# Patient Record
Sex: Female | Born: 1970 | Race: White | Hispanic: No | Smoking: Never smoker
Health system: Southern US, Community
[De-identification: ages and names within clinical notes are randomized; demographics above are authoritative.]

## PROBLEM LIST (undated history)

## (undated) DIAGNOSIS — E559 Vitamin D deficiency, unspecified: Secondary | ICD-10-CM

## (undated) DIAGNOSIS — A630 Anogenital (venereal) warts: Secondary | ICD-10-CM

## (undated) DIAGNOSIS — M722 Plantar fascial fibromatosis: Secondary | ICD-10-CM

## (undated) HISTORY — DX: Vitamin D deficiency, unspecified: E55.9

## (undated) HISTORY — DX: Plantar fascial fibromatosis: M72.2

## (undated) HISTORY — PX: EYE SURGERY: SHX253

## (undated) HISTORY — DX: Anogenital (venereal) warts: A63.0

---
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
## 2000-05-08 ENCOUNTER — Encounter: Admission: RE | Admit: 2000-05-08 | Discharge: 2000-05-08 | Payer: Self-pay | Admitting: Internal Medicine

## 2000-05-08 ENCOUNTER — Encounter: Payer: Self-pay | Admitting: Internal Medicine

## 2000-10-11 ENCOUNTER — Other Ambulatory Visit: Admission: RE | Admit: 2000-10-11 | Discharge: 2000-10-11 | Payer: Self-pay | Admitting: Obstetrics and Gynecology

## 2002-07-17 ENCOUNTER — Other Ambulatory Visit: Admission: RE | Admit: 2002-07-17 | Discharge: 2002-07-17 | Payer: Self-pay | Admitting: Obstetrics and Gynecology

## 2003-08-20 ENCOUNTER — Other Ambulatory Visit: Admission: RE | Admit: 2003-08-20 | Discharge: 2003-08-20 | Payer: Self-pay | Admitting: Obstetrics and Gynecology

## 2012-09-29 LAB — HM PAP SMEAR: HM Pap smear: NORMAL

## 2012-11-21 ENCOUNTER — Ambulatory Visit: Payer: Self-pay | Admitting: Internal Medicine

## 2012-12-12 ENCOUNTER — Ambulatory Visit (INDEPENDENT_AMBULATORY_CARE_PROVIDER_SITE_OTHER): Payer: BC Managed Care – PPO | Admitting: Internal Medicine

## 2012-12-12 ENCOUNTER — Telehealth: Payer: Self-pay | Admitting: *Deleted

## 2012-12-12 ENCOUNTER — Encounter: Payer: Self-pay | Admitting: Internal Medicine

## 2012-12-12 VITALS — BP 106/70 | HR 71 | Temp 98.1°F | Resp 14 | Ht 64.0 in | Wt 159.2 lb

## 2012-12-12 DIAGNOSIS — E538 Deficiency of other specified B group vitamins: Secondary | ICD-10-CM

## 2012-12-12 DIAGNOSIS — G8929 Other chronic pain: Secondary | ICD-10-CM

## 2012-12-12 DIAGNOSIS — Z0000 Encounter for general adult medical examination without abnormal findings: Secondary | ICD-10-CM

## 2012-12-12 DIAGNOSIS — M545 Low back pain: Secondary | ICD-10-CM

## 2012-12-12 NOTE — Progress Notes (Signed)
Patient ID: Erin Boone, female   DOB: [birth year], 42 y.o.   MRN: [id number]   Patient Active Problem List   Diagnosis Date Noted  . Chronic low back pain 12/14/2012  . B12 deficiency 12/14/2012  . Routine general medical examination at a health care facility 12/14/2012    Subjective:  CC:   Chief Complaint  Patient presents with  . Establish Care    HPI:   Erin Boone is a 42 y.o. female who presents as a new patient to establish primary care with the chief complaint of Left sided low back pain, chronic,  And constant.  Dull pain most of the time aggravated by standing or stooping or leaning  relieved by lying down.  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  .  Stretches twice weekly with a trainer.  Sharp  Lumbar near her dimple on the left.  Hurst worse after stretching.  Plain films done by   Chiropractor  Years ago.    Past Medical History  Diagnosis Date  . Genital warts     Past Surgical History  Procedure Laterality Date  . Eye surgery    . Tonsillectomy and adenoidectomy Bilateral 1987    Family History  Problem Relation Age of Onset  . Cancer Mother   . Hypertension Mother   . Dementia Mother   . Arthritis Father   . Hypertension Father   . Diabetes Sister   . Diabetes Paternal Aunt   . Diabetes Paternal Uncle   . Heart disease Maternal Grandfather   . Diabetes Paternal Grandmother   . Diabetes Paternal Grandfather     History   Social History  . Marital Status: Married    Spouse Name: N/A    Number of Children: N/A  . Years of Education: N/A   Occupational History  . Not on file.   Social History Main Topics  . Smoking status: Never Smoker   . Smokeless tobacco: Never Used  . Alcohol Use: Yes  . Drug Use: No  . Sexually Active: No   Other Topics Concern  . Not on file   Social History Narrative  . No narrative on file        @ALLHX @    Review of Systems:   The remainder of the review of systems was negative except those addressed in the HPI.       Objective:  BP 106/70  Pulse 71  Temp(Src) 98.1 F (36.7 C) (Oral)  Resp 14  Ht 5\' 4"  (1.626 m)  Wt 159 lb 4 oz (72.235 kg)  BMI 27.32 kg/m2  SpO2 98%  LMP 10/17/2012  General appearance: alert, cooperative and appears stated age Ears: normal TM's and external ear canals both ears Throat: lips, mucosa, and tongue normal; teeth and gums normal Neck: no adenopathy, no carotid bruit, supple, symmetrical, trachea midline and thyroid not enlarged, symmetric, no tenderness/mass/nodules Back: symmetric, no curvature. ROM normal. No CVA tenderness. Lungs: clear to auscultation bilaterally Heart: regular rate and rhythm, S1, S2 normal, no murmur, click, rub or gallop Abdomen: soft, non-tender; bowel sounds normal; no masses,  no organomegaly Pulses: 2+ and symmetric Skin: Skin color, texture, turgor normal. No rashes or lesions Lymph nodes: Cervical, supraclavicular, and axillary nodes normal.  Assessment and Plan:  Chronic low back pain Exam normal.  contineu stretching, core strengthening exercises and prn nsaids,  Weight  loss may help,  Plain films ordered   B12 deficiency History of macroytosis by labs drawn in 2013,  Presume to be b12 deficiency by former PCP.  She has not been taking a b12 supplement.  She will return for fasting labs and b12 level ASAP  Routine general medical examination at a health care facility She is up to date on screening but has not had a baseline mammogram ye.t    Updated Medication List No outpatient encounter prescriptions on file as of 12/12/2012.   No facility-administered encounter medications on file as of 12/12/2012.     Orders Placed This Encounter  Procedures  . DG Lumbar Spine Complete  . HM PAP SMEAR    No Follow-up on file.

## 2012-12-12 NOTE — Telephone Encounter (Signed)
Edison Simon the lab order form from [employer]

## 2012-12-12 NOTE — Telephone Encounter (Signed)
[facility] lab sheet ordered will mail to patient as available . Have Dr. Darrick Huntsman Sign

## 2012-12-12 NOTE — Patient Instructions (Addendum)
You need to have your b12 drawn to determine if you need to resume B12  Supplements (through [facility])   I recommend repeating your spine films at [facility] may use ibuprofen 800 mg ( that's 4 x 200 mg tablets) every 8 hours and  2 tylenol every 8 hours for a few days if your back flares.  maximum tylenol dose is 2000 mg daily if you take it continually

## 2012-12-14 DIAGNOSIS — M545 Other chronic pain: Secondary | ICD-10-CM | POA: Insufficient documentation

## 2012-12-14 DIAGNOSIS — Z0000 Encounter for general adult medical examination without abnormal findings: Secondary | ICD-10-CM | POA: Insufficient documentation

## 2012-12-14 DIAGNOSIS — E538 Deficiency of other specified B group vitamins: Secondary | ICD-10-CM | POA: Insufficient documentation

## 2012-12-14 NOTE — Assessment & Plan Note (Signed)
She is up to date on screening but has not had a baseline mammogram ye.t

## 2012-12-14 NOTE — Assessment & Plan Note (Addendum)
History of macroytosis by labs drawn in 2013,  Presume to be b12 deficiency by former PCP.  She has not been taking a b12 supplement.  She will return for fasting labs and b12 level ASAP

## 2012-12-14 NOTE — Assessment & Plan Note (Signed)
Exam normal.  contineu stretching, core strengthening exercises and prn nsaids,  Weight loss may help,  Plain films ordered

## 2012-12-15 NOTE — Telephone Encounter (Signed)
Placed lab sheet in red folder , will mail to patient when completed.

## 2012-12-22 NOTE — Telephone Encounter (Signed)
Lab sheet mailed to patient.

## 2012-12-23 ENCOUNTER — Encounter: Payer: Self-pay | Admitting: Internal Medicine

## 2012-12-29 ENCOUNTER — Telehealth: Payer: Self-pay | Admitting: *Deleted

## 2012-12-29 DIAGNOSIS — R5381 Other malaise: Secondary | ICD-10-CM

## 2012-12-29 DIAGNOSIS — E538 Deficiency of other specified B group vitamins: Secondary | ICD-10-CM

## 2012-12-29 DIAGNOSIS — E785 Hyperlipidemia, unspecified: Secondary | ICD-10-CM

## 2012-12-29 NOTE — Telephone Encounter (Signed)
Pt is coming in for labs tomorrow 07.15.2014 what labs and dx?

## 2012-12-30 ENCOUNTER — Other Ambulatory Visit (INDEPENDENT_AMBULATORY_CARE_PROVIDER_SITE_OTHER): Payer: BC Managed Care – PPO

## 2012-12-30 DIAGNOSIS — R5381 Other malaise: Secondary | ICD-10-CM

## 2012-12-30 DIAGNOSIS — R5383 Other fatigue: Secondary | ICD-10-CM

## 2012-12-30 DIAGNOSIS — E538 Deficiency of other specified B group vitamins: Secondary | ICD-10-CM

## 2012-12-30 DIAGNOSIS — E785 Hyperlipidemia, unspecified: Secondary | ICD-10-CM

## 2012-12-30 LAB — COMPREHENSIVE METABOLIC PANEL
ALT: 16 U/L (ref 0–35)
AST: 17 U/L (ref 0–37)
Albumin: 4.2 g/dL (ref 3.5–5.2)
Alkaline Phosphatase: 54 U/L (ref 39–117)
BUN: 13 mg/dL (ref 6–23)
CO2: 27 mEq/L (ref 19–32)
Calcium: 9.1 mg/dL (ref 8.4–10.5)
Chloride: 103 mEq/L (ref 96–112)
Creatinine, Ser: 0.7 mg/dL (ref 0.4–1.2)
GFR: 93.1 mL/min (ref >60.00)
Glucose, Bld: 94 mg/dL (ref 70–99)
Potassium: 4.6 mEq/L (ref 3.5–5.1)
Sodium: 135 mEq/L (ref 135–145)
Total Bilirubin: 0.8 mg/dL (ref 0.3–1.2)
Total Protein: 7.2 g/dL (ref 6.0–8.3)

## 2012-12-30 LAB — CBC WITH DIFFERENTIAL/PLATELET
Basophils Absolute: 0 10*3/uL (ref 0.0–0.1)
Basophils Relative: 0.4 % (ref 0.0–3.0)
Eosinophils Absolute: 0.1 10*3/uL (ref 0.0–0.7)
Eosinophils Relative: 1.6 % (ref 0.0–5.0)
HCT: 38.5 % (ref 36.0–46.0)
Hemoglobin: 13.2 g/dL (ref 12.0–15.0)
Lymphocytes Relative: 28.4 % (ref 12.0–46.0)
Lymphs Abs: 1.7 10*3/uL (ref 0.7–4.0)
MCHC: 34.2 g/dL (ref 30.0–36.0)
MCV: 98.9 fl (ref 78.0–100.0)
Monocytes Absolute: 0.5 10*3/uL (ref 0.1–1.0)
Monocytes Relative: 8.7 % (ref 3.0–12.0)
Neutro Abs: 3.6 10*3/uL (ref 1.4–7.7)
Neutrophils Relative %: 60.9 % (ref 43.0–77.0)
Platelets: 189 10*3/uL (ref 150.0–400.0)
RBC: 3.89 Mil/uL (ref 3.87–5.11)
RDW: 12.7 % (ref 11.5–14.6)
WBC: 6 10*3/uL (ref 4.5–10.5)

## 2012-12-30 LAB — LIPID PANEL
HDL: 72.9 mg/dL (ref >39.00)
LDL Cholesterol: 104 mg/dL — ABNORMAL HIGH (ref 0–99)
Total CHOL/HDL Ratio: 3
Triglycerides: 45 mg/dL (ref 0.0–149.0)

## 2012-12-30 LAB — VITAMIN B12: Vitamin B-12: 251 pg/mL (ref 211–911)

## 2012-12-30 LAB — TSH: TSH: 1.28 u[IU]/mL (ref 0.35–5.50)

## 2012-12-31 ENCOUNTER — Encounter: Payer: Self-pay | Admitting: Internal Medicine

## 2013-01-01 ENCOUNTER — Encounter: Payer: Self-pay | Admitting: Internal Medicine

## 2013-02-02 ENCOUNTER — Encounter: Payer: Self-pay | Admitting: Internal Medicine

## 2013-02-02 DIAGNOSIS — A630 Anogenital (venereal) warts: Secondary | ICD-10-CM

## 2013-02-03 DIAGNOSIS — A630 Anogenital (venereal) warts: Secondary | ICD-10-CM | POA: Insufficient documentation

## 2013-02-03 MED ORDER — PODOFILOX 0.5 % EX SOLN: Freq: Two times a day (BID) | CUTANEOUS | Status: DC

## 2013-02-03 NOTE — Telephone Encounter (Signed)
rx sent to pharmacy for podofilox ,m if no resolution after 3 weeks,  Needs appt

## 2013-04-01 ENCOUNTER — Encounter: Payer: Self-pay | Admitting: Internal Medicine

## 2013-04-23 ENCOUNTER — Other Ambulatory Visit: Payer: Self-pay

## 2013-05-22 ENCOUNTER — Telehealth: Payer: Self-pay | Admitting: Internal Medicine

## 2013-05-22 NOTE — Telephone Encounter (Signed)
Pt left vm.  Has questions regarding OTC medication.  Asking for a call so she can explain her symptoms.

## 2013-05-22 NOTE — Telephone Encounter (Signed)
Patient ask what to take OTC for cough advised you normally recommend Delsym right. NO fever, just irritating cough at night advised also if continues she may need an appointment,

## 2014-01-08 ENCOUNTER — Telehealth: Payer: Self-pay | Admitting: Internal Medicine

## 2014-01-08 ENCOUNTER — Ambulatory Visit (INDEPENDENT_AMBULATORY_CARE_PROVIDER_SITE_OTHER)
Admission: RE | Admit: 2014-01-08 | Discharge: 2014-01-08 | Disposition: A | Discharge status: Home / Self Care | Payer: BC Managed Care – PPO | Source: Ambulatory Visit | Attending: Internal Medicine | Admitting: Internal Medicine

## 2014-01-08 DIAGNOSIS — M545 Low back pain, unspecified: Secondary | ICD-10-CM

## 2014-01-08 DIAGNOSIS — M79604 Pain in right leg: Secondary | ICD-10-CM

## 2014-01-08 NOTE — Telephone Encounter (Signed)
Patient called to see if order for Xray for Sciatica nerve pain could still be performed patient is still having trouble with this pain . Order does not expire until 02/11/14 advised patient patient she can still go to [facility] for [employer]. FYI Patient has an appointment with you on 02/12/14

## 2014-01-10 ENCOUNTER — Encounter: Payer: Self-pay | Admitting: Internal Medicine

## 2014-01-10 DIAGNOSIS — M545 Low back pain, unspecified: Secondary | ICD-10-CM

## 2014-01-12 NOTE — Telephone Encounter (Signed)
Please advise patient would like PT in [place] and has to dosage patient of Ibuprofen.

## 2014-01-27 ENCOUNTER — Ambulatory Visit: Payer: BC Managed Care – PPO | Attending: Internal Medicine

## 2014-01-27 DIAGNOSIS — M545 Low back pain, unspecified: Secondary | ICD-10-CM | POA: Insufficient documentation

## 2014-01-27 DIAGNOSIS — IMO0001 Reserved for inherently not codable concepts without codable children: Secondary | ICD-10-CM | POA: Insufficient documentation

## 2014-02-02 ENCOUNTER — Ambulatory Visit: Payer: BC Managed Care – PPO

## 2014-02-02 DIAGNOSIS — IMO0001 Reserved for inherently not codable concepts without codable children: Secondary | ICD-10-CM | POA: Diagnosis not present

## 2014-02-04 ENCOUNTER — Ambulatory Visit: Payer: BC Managed Care – PPO | Admitting: Rehabilitation

## 2014-02-04 DIAGNOSIS — IMO0001 Reserved for inherently not codable concepts without codable children: Secondary | ICD-10-CM | POA: Diagnosis not present

## 2014-02-09 ENCOUNTER — Ambulatory Visit: Payer: BC Managed Care – PPO

## 2014-02-09 DIAGNOSIS — IMO0001 Reserved for inherently not codable concepts without codable children: Secondary | ICD-10-CM | POA: Diagnosis not present

## 2014-02-11 ENCOUNTER — Ambulatory Visit: Payer: BC Managed Care – PPO | Admitting: Physical Therapy

## 2014-02-12 ENCOUNTER — Ambulatory Visit (INDEPENDENT_AMBULATORY_CARE_PROVIDER_SITE_OTHER): Payer: BC Managed Care – PPO | Admitting: Internal Medicine

## 2014-02-12 ENCOUNTER — Encounter: Payer: Self-pay | Admitting: Internal Medicine

## 2014-02-12 VITALS — BP 104/68 | HR 75 | Temp 97.9°F | Resp 16 | Ht 64.25 in | Wt 165.2 lb

## 2014-02-12 DIAGNOSIS — M545 Low back pain, unspecified: Secondary | ICD-10-CM

## 2014-02-12 DIAGNOSIS — G8929 Other chronic pain: Secondary | ICD-10-CM

## 2014-02-12 DIAGNOSIS — E663 Overweight: Secondary | ICD-10-CM

## 2014-02-12 DIAGNOSIS — M79609 Pain in unspecified limb: Secondary | ICD-10-CM

## 2014-02-12 DIAGNOSIS — Z1239 Encounter for other screening for malignant neoplasm of breast: Secondary | ICD-10-CM

## 2014-02-12 DIAGNOSIS — R5383 Other fatigue: Secondary | ICD-10-CM

## 2014-02-12 DIAGNOSIS — R635 Abnormal weight gain: Secondary | ICD-10-CM

## 2014-02-12 DIAGNOSIS — M722 Plantar fascial fibromatosis: Secondary | ICD-10-CM

## 2014-02-12 DIAGNOSIS — M79661 Pain in right lower leg: Secondary | ICD-10-CM

## 2014-02-12 DIAGNOSIS — R5381 Other malaise: Secondary | ICD-10-CM

## 2014-02-12 MED ORDER — MELOXICAM 15 MG PO TABS: 15.0000 mg | ORAL_TABLET | Freq: Every day | ORAL | Status: DC

## 2014-02-12 MED ORDER — TRAMADOL HCL 50 MG PO TABS: 50.0000 mg | ORAL_TABLET | Freq: Three times a day (TID) | ORAL | Status: DC | PRN

## 2014-02-12 NOTE — Progress Notes (Signed)
Pre-visit discussion using our clinic review tool. No additional management support is needed unless otherwise documented below in the visit note.  

## 2014-02-12 NOTE — Patient Instructions (Addendum)
For your back pain  Once daily meloxicam and 1000 mg tylenol  in the am. No more ibuprofen  Evening:  Tramadol  In the evening   Referral to Erin Boone for therapy for the back   For the weight loss:  Low carb diet 30 minutes of low impact aerobics 5 days per week Phentermine appetite suppressant if no results in one month       This is  my version of a  "Low GI"  Diet:  It will  allow you to lose 4 to 8  lbs  per month if you follow it carefully.  Your goal with exercise is a minimum of 30 minutes of aerobic exercise 5 days per week (Walking does not count once it becomes easy!)     All of the foods can be found at grocery stores and in bulk at [employer].  The Atkins protein bars and shakes are available in more varieties at Target, [employer] and [employer].     7 AM Breakfast:  Choose from the following:  Low carbohydrate Protein  Shakes (I recommend the EAS AdvantEdge "Carb Control" shakes  Or the low carb shakes by Atkins.    2.5 carbs   Arnold's "Sandwhich Thin"toasted  w/ peanut butter (no jelly: about 20 net carbs  "Bagel Thin" with cream cheese and salmon: about 20 carbs   a scrambled egg/bacon/cheese burrito made with Mission's "carb balance" whole wheat tortilla  (about 10 net carbs )  A slice of home made fritatta (egg based dish without a crust:  google it)    Avoid cereal and bananas, oatmeal and cream of wheat and grits. They are loaded with carbohydrates!   10 AM: high protein snack  Protein bar by Atkins (the snack size, under 200 cal, usually < 6 net carbs).    A stick of cheese:  Around 1 carb,  100 cal     Dannon Light n Fit [place] Yogurt  (80 cal, 8 carbs)  Other so called "protein bars" and Greek yogurts tend to be loaded with carbohydrates.  Remember, in food advertising, the word "energy" is synonymous for " carbohydrate."  Lunch:   A Sandwich using the bread choices listed, Can use any  Eggs,  lunchmeat, grilled meat or canned tuna), avocado, regular  mayo/mustard  and cheese.  A Salad using blue cheese, ranch,  Goddess or vinagrette,  No croutons or "confetti" and no "candied nuts" but regular nuts OK.   No pretzels or chips.  Pickles and miniature sweet peppers are a good low carb alternative that provide a "crunch"  The bread is the only source of carbohydrate in a sandwich and  can be decreased by trying some of these alternatives to traditional loaf bread  Erin Boone makes a pita bread and a flat bread that are 50 cal and 4 net carbs available at [place] [employer] [place].  This can be toasted to use with hummous as well  Toufayan makes a low carb flatbread that's 100 cal and 9 net carbs available at [employer] and [employer]J's makes 2 sizes of  Low carb whole wheat tortilla  (The large one is 210 cal and 6 net carbs) Avoid "Low fat dressings, as well as Barry Brunner and [place] dressings They are loaded with sugar!   3 PM/ Mid day  Snack:  Consider  1 ounce of  almonds, walnuts, pistachios, pecans, peanuts,  Macadamia nuts or a nut medley.  Avoid "granola"; the dried  cranberries and raisins are loaded with carbohydrates. Mixed nuts as long as there are no raisins,  cranberries or dried fruit.    Try the prosciutto/mozzarella cheese sticks by Fiorruci  In deli /backery section   High protein   To avoid overindulging in snacks: Try drinking a glass of unsweeted almond/coconut milk  Or a cup of coffee with your Atkins chocolate bar t o keep you from having 3!!!        6 PM  Dinner:     Meat/fowl/fish with a green salad, and either broccoli, cauliflower, green beans, spinach, brussel sprouts or  Lima beans. DO NOT BREAD THE PROTEIN!!      There is a low carb pasta by Dreamfield's that is acceptable and tastes great: only 5 digestible carbs/serving.( All grocery stores but BJs carry it )  Try Hurley Cisco Angelo's chicken piccata or chicken or eggplant parm over low carb pasta.(Lowes and BJs)   Marjory Lies Sanchez's "Carnitas" (pulled pork, no sauce,  0  carbs) or his beef pot roast to make a dinner burrito (at BJ's)  Pesto over low carb pasta (bj's sells a good quality pesto in the center refrigerated section of the deli   Try satueeing  Cheral Marker with mushroooms  Whole wheat pasta is still full of digestible carbs and  Not as low in glycemic index as Dreamfield's.   Brown rice is still rice,  So skip the rice and noodles if you eat [place] or [place] (or at least limit to 1/2 cup)  9 PM snack :   Breyer's "low carb" fudgsicle or  ice cream bar (Carb Smart line), or  Weight Watcher's ice cream bar , or another "no sugar added" ice cream;  a serving of fresh berries/cherries with whipped cream   Cheese or DANNON'S LlGHT N FIT GREEK YOGURT or the Oikos greek yogurt   8 ounces of [place] unsweetened almond/cococunut milk    Avoid bananas, pineapple, grapes  and watermelon on a regular basis because they are high in sugar.  THINK OF THEM AS DESSERT  Remember that snack Substitutions should be less than 10 NET carbs per serving and meals < 20 carbs. Remember to subtract fiber grams to get the "net carbs."

## 2014-02-12 NOTE — Progress Notes (Signed)
Patient ID: Erin Boone, female   DOB: October 03, 1970, 43 y.o.   MRN: [id number]  Patient Active Problem List   Diagnosis Date Noted  . Right calf pain 02/14/2014  . Plantar fasciitis of left foot 02/14/2014  . Overweight (BMI 25.0-29.9) 02/14/2014  . Vulvovaginal warts 02/03/2013  . Chronic low back pain 12/14/2012  . B12 deficiency 12/14/2012  . Routine general medical examination at a health care facility 12/14/2012    Subjective:  CC:   Chief Complaint  Patient presents with  . Annual Exam    sees  Gyn for PAP and breast exam.  . Back Pain    Physical therapy last 2 weeks. Has not seen a cahnge  . Leg Pain    right calf pain feels like an electrical jolt,    HPI:   Erin Boone is a 43 y.o. female who presents for her annual exam with Multiple pain  complaints.. She was last seen a year ago as a new patient.  Due to the time needed to evaluate her complaints. No annual exam was done today.   Left sided sacroiliac pain 24/.7 aggravated by arching back and forward flexion .  She wakes up with exruciating pain in the lower back .  Has been receiving PT for the  past 2 weeks, and despite use of an applied steroid patch and stretching , her pain has not improved.   Right calf has developed a pain in a spcific area that is severe when touched but not otherwise.  It was found by her massage therapist.  Has not been present for a few days.  He suggested it may be referred from her back .  Pain on the plantar surface of her Left foot on in the mid foot region feels like a bone bruise .   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  Sharp  Lumbar near her dimple on the left.  Hurst worse after stretching.  Plain films done by   Chiropractor     Past Medical History  Diagnosis Date  . Genital warts     Past Surgical History  Procedure Laterality Date  . Eye surgery    . Tonsillectomy and  adenoidectomy Bilateral 1987       The following portions of the patient's history were reviewed and updated as appropriate: Allergies, current medications, and problem list.    Review of Systems:   Patient denies headache, fevers, malaise, unintentional weight loss, skin rash, eye pain, sinus congestion and sinus pain, sore throat, dysphagia,  hemoptysis , cough, dyspnea, wheezing, chest pain, palpitations, orthopnea, edema, abdominal pain, nausea, melena, diarrhea, constipation, flank pain, dysuria, hematuria, urinary  Frequency, nocturia, numbness, tingling, seizures,  Focal weakness, Loss of consciousness,  Tremor, insomnia, depression, anxiety, and suicidal ideation.     History   Social History  . Marital Status: Married    Spouse Name: N/A    Number of Children: N/A  . Years of Education: N/A   Occupational History  . Not on file.   Social History Main Topics  . Smoking status: Never Smoker   . Smokeless tobacco: Never Used  . Alcohol Use: Yes  . Drug Use: No  . Sexual Activity: No   Other Topics Concern  . Not on file   Social History Narrative  . No narrative on file  Objective:  Filed Vitals:   02/12/14 1536  BP: 104/68  Pulse: 75  Temp: 97.9 F (36.6 C)  Resp: 16     General appearance: alert, cooperative and appears stated age Ears: normal TM's and external ear canals both ears Throat: lips, mucosa, and tongue normal; teeth and gums normal Neck: no adenopathy, no carotid bruit, supple, symmetrical, trachea midline and thyroid not enlarged, symmetric, no tenderness/mass/nodules Back: symmetric, no curvature. ROM normal. No CVA tenderness. Lungs: clear to auscultation bilaterally Heart: regular rate and rhythm, S1, S2 normal, no murmur, click, rub or gallop Abdomen: soft, non-tender; bowel sounds normal; no masses,  no organomegaly Pulses: 2+ and symmetric Skin: Skin color, texture, turgor normal. No rashes or lesions Lymph nodes: Cervical,  supraclavicular, and axillary nodes normal. MSK: lumbosacral tenderness over sacroilaic joints,  Negative straight leg lift. Patellar DTRs normal Right Calf nontender,  No swelling or erythema.   Assessment and Plan:  Chronic low back pain With prior plain films done by her chiropractor a year ago.  No recent injury to aggravate pain.  No improvement with PT. Need to rule out spinal stenosis.  Plain films ordere d  Right calf pain She has no risk factors fo DVT and has a negative Homan's sign. She is currently pain free. Agree with massage therapist that her pain is MSK but not liley referred from back given focal area of concern previously noted.   Plantar fasciitis of left foot Suggested by history and exam.  Relieving measures suggested,  Podiatry referral if no improvement.   Overweight (BMI 25.0-29.9) I have addressed  BMI and recommended wt loss of 10% of body weight over the next 6 months using a low glycemic index diet and regular exercise a minimum of 5 days per week.   She will return for fasting lipids,  Diabetes and thyroid screen.    A total of 40 minutes was spent with patient more than half of which was spent in counseling patient on the above mentioned issues ,  and coordination of care.  Updated Medication List Outpatient Encounter Prescriptions as of 02/12/2014  Medication Sig  . cyanocobalamin 1000 MCG tablet Take 100 mcg by mouth daily.  Marland Kitchen ibuprofen (ADVIL,MOTRIN) 200 MG tablet Take 600 mg by mouth every 8 (eight) hours as needed.  . meloxicam (MOBIC) 15 MG tablet Take 1 tablet (15 mg total) by mouth daily.  . podofilox (CONDYLOX) 0.5 % external solution Apply topically 2 (two) times daily. For three days,  Then stop.,  May repeat weekly x 4  . traMADol (ULTRAM) 50 MG tablet Take 1 tablet (50 mg total) by mouth every 8 (eight) hours as needed.     Orders Placed This Encounter  Procedures  . MM DIGITAL SCREENING BILATERAL  . CBC with Differential  .  Comprehensive metabolic panel  . TSH  . Lipid panel    No Follow-up on file.

## 2014-02-14 ENCOUNTER — Encounter: Payer: Self-pay | Admitting: Internal Medicine

## 2014-02-14 DIAGNOSIS — E66811 Obesity, class 1: Secondary | ICD-10-CM | POA: Insufficient documentation

## 2014-02-14 DIAGNOSIS — M79661 Pain in right lower leg: Secondary | ICD-10-CM | POA: Insufficient documentation

## 2014-02-14 DIAGNOSIS — E663 Overweight: Secondary | ICD-10-CM | POA: Insufficient documentation

## 2014-02-14 DIAGNOSIS — E669 Obesity, unspecified: Secondary | ICD-10-CM | POA: Insufficient documentation

## 2014-02-14 DIAGNOSIS — M722 Plantar fascial fibromatosis: Secondary | ICD-10-CM | POA: Insufficient documentation

## 2014-02-14 HISTORY — DX: Obesity, class 1: E66.811

## 2014-02-14 NOTE — Assessment & Plan Note (Signed)
Suggested by history and exam.  Relieving measures suggested,  Podiatry referral if no improvement.

## 2014-02-14 NOTE — Assessment & Plan Note (Signed)
I have addressed  BMI and recommended wt loss of 10% of body weight over the next 6 months using a low glycemic index diet and regular exercise a minimum of 5 days per week.   She will return for fasting lipids,  Diabetes and thyroid screen.

## 2014-02-14 NOTE — Assessment & Plan Note (Signed)
With prior plain films done by her chiropractor a year ago.  No recent injury to aggravate pain.  No improvement with PT. Need to rule out spinal stenosis.  Plain films ordere d

## 2014-02-14 NOTE — Assessment & Plan Note (Signed)
She has no risk factors fo DVT and has a negative Homan's sign. She is currently pain free. Agree with massage therapist that her pain is MSK but not liley referred from back given focal area of concern previously noted.

## 2014-02-15 ENCOUNTER — Encounter: Payer: Self-pay | Admitting: Internal Medicine

## 2014-02-15 ENCOUNTER — Ambulatory Visit (INDEPENDENT_AMBULATORY_CARE_PROVIDER_SITE_OTHER): Payer: BC Managed Care – PPO | Admitting: Internal Medicine

## 2014-02-15 VITALS — BP 108/66 | HR 87 | Temp 97.9°F | Wt 162.0 lb

## 2014-02-15 DIAGNOSIS — R52 Pain, unspecified: Secondary | ICD-10-CM

## 2014-02-15 DIAGNOSIS — K2970 Gastritis, unspecified, without bleeding: Secondary | ICD-10-CM

## 2014-02-15 DIAGNOSIS — K2990 Gastroduodenitis, unspecified, without bleeding: Secondary | ICD-10-CM

## 2014-02-15 DIAGNOSIS — R109 Unspecified abdominal pain: Secondary | ICD-10-CM

## 2014-02-15 DIAGNOSIS — R1110 Vomiting, unspecified: Secondary | ICD-10-CM

## 2014-02-15 DIAGNOSIS — R1115 Cyclical vomiting syndrome unrelated to migraine: Secondary | ICD-10-CM

## 2014-02-15 LAB — POCT URINALYSIS DIPSTICK
Bilirubin, UA: NEGATIVE
Glucose, UA: NEGATIVE
Ketones, UA: NEGATIVE
Leukocytes, UA: NEGATIVE
NITRITE UA: NEGATIVE
Protein, UA: NEGATIVE
Spec Grav, UA: <=1.005
UROBILINOGEN UA: NEGATIVE
pH, UA: 6

## 2014-02-15 MED ORDER — PANTOPRAZOLE SODIUM 40 MG PO TBEC: DELAYED_RELEASE_TABLET | ORAL | Status: DC

## 2014-02-15 MED ORDER — ONDANSETRON HCL 4 MG PO TABS: 4.0000 mg | ORAL_TABLET | Freq: Three times a day (TID) | ORAL | Status: DC | PRN

## 2014-02-15 NOTE — Patient Instructions (Addendum)

## 2014-02-15 NOTE — Addendum Note (Signed)
Addended by: Lurlean Nanny on: 02/15/2014 11:50 AM   Modules accepted: Orders

## 2014-02-15 NOTE — Progress Notes (Signed)
Subjective:    Patient ID: Erin Boone, female    DOB: [birth year], 43 y.o.   MRN: [id number]  HPI  Pt presents to the clinic today with c/o abdominal pain, nausea and diarrhea. She reports this started 2 days ago. She has had some associated body aches and chills. She is unsure if she has had a fever. The abdominal pain is intermittent. She describes it as sharp and crampy. It seems to be worse if she lays on her left side. Her stools are loose, but normal in color. She denies blood in her stool. She has tried pepto bismol and tramadol with minimal relief. She did start meloxicam and tramadol just prior to the abdominal pain occurring. She has not taken either of these medications before. She has not had sick contacts that she is aware of.  Review of Systems      Past Medical History  Diagnosis Date  . Genital warts     Current Outpatient Prescriptions  Medication Sig Dispense Refill  . cyanocobalamin 1000 MCG tablet Take 100 mcg by mouth daily.      Marland Kitchen ibuprofen (ADVIL,MOTRIN) 200 MG tablet Take 600 mg by mouth every 8 (eight) hours as needed.      . meloxicam (MOBIC) 15 MG tablet Take 1 tablet (15 mg total) by mouth daily.  30 tablet  3  . podofilox (CONDYLOX) 0.5 % external solution Apply topically 2 (two) times daily. For three days,  Then stop.,  May repeat weekly x 4  3.5 mL  0  . traMADol (ULTRAM) 50 MG tablet Take 1 tablet (50 mg total) by mouth every 8 (eight) hours as needed.  90 tablet  2   No current facility-administered medications for this visit.    No Known Allergies  Family History  Problem Relation Age of Onset  . Cancer Mother   . Hypertension Mother   . Dementia Mother   . Arthritis Father   . Hypertension Father   . Diabetes Sister   . Diabetes Paternal Aunt   . Diabetes Paternal Uncle   . Heart disease Maternal Grandfather   . Diabetes Paternal Grandmother   . Diabetes Paternal Grandfather     History   Social History  . Marital Status: Married      Spouse Name: N/A    Number of Children: N/A  . Years of Education: N/A   Occupational History  . Not on file.   Social History Main Topics  . Smoking status: Never Smoker   . Smokeless tobacco: Never Used  . Alcohol Use: Yes  . Drug Use: No  . Sexual Activity: No   Other Topics Concern  . Not on file   Social History Narrative  . No narrative on file     Constitutional: Denies fever, malaise, fatigue, headache or abrupt weight changes.  Respiratory: Denies difficulty breathing, shortness of breath, cough or sputum production.   Cardiovascular: Denies chest pain, chest tightness, palpitations or swelling in the hands or feet.  Gastrointestinal: Pt reports abdominal pain, nausea, vomiting and diarrhea. . Denies bloating, constipation, or blood in the stool.    No other specific complaints in a complete review of systems (except as listed in HPI above).  Objective:   Physical Exam  BP 108/66  Pulse 87  Temp(Src) 97.9 F (36.6 C) (Oral)  Wt 162 lb (73.483 kg)  SpO2 98%  LMP 01/18/2014 Wt Readings from Last 3 Encounters:  02/15/14 162 lb (73.483 kg)  02/12/14 165  lb 4 oz (74.957 kg)  12/12/12 159 lb 4 oz (72.235 kg)    General: Appears her stated age, well developed, well nourished in NAD. Cardiovascular: Normal rate and rhythm. S1,S2 noted.  No murmur, rubs or gallops noted. Pulmonary/Chest: Normal effort and positive vesicular breath sounds. No respiratory distress. No wheezes, rales or ronchi noted.  Abdomen: Soft and generally tender. Hypoactive bowel sounds, no bruits noted. No distention or masses noted. Liver, spleen and kidneys non palpable.   BMET    Component Value Date/Time   NA 135 12/30/2012 1009   K 4.6 12/30/2012 1009   CL 103 12/30/2012 1009   CO2 27 12/30/2012 1009   GLUCOSE 94 12/30/2012 1009   BUN 13 12/30/2012 1009   CREATININE 0.7 12/30/2012 1009   CALCIUM 9.1 12/30/2012 1009    Lipid Panel     Component Value Date/Time   CHOL 186  12/30/2012 1009   TRIG 45.0 12/30/2012 1009   HDL 72.90 12/30/2012 1009   CHOLHDL 3 12/30/2012 1009   VLDL 9.0 12/30/2012 1009   LDLCALC 104* 12/30/2012 1009    CBC    Component Value Date/Time   WBC 6.0 12/30/2012 1009   RBC 3.89 12/30/2012 1009   HGB 13.2 12/30/2012 1009   HCT 38.5 12/30/2012 1009   PLT 189.0 12/30/2012 1009   MCV 98.9 12/30/2012 1009   MCHC 34.2 12/30/2012 1009   RDW 12.7 12/30/2012 1009   LYMPHSABS 1.7 12/30/2012 1009   MONOABS 0.5 12/30/2012 1009   EOSABS 0.1 12/30/2012 1009   BASOSABS 0.0 12/30/2012 1009    Hgb A1C No results found for this basename: HGBA1C         Assessment & Plan:   Gastritis:  Likely d/t to taking Meloxicam and Tramadol too close together Will start protonix to help coat the stomach short term eRx for zofran for nausea Try imodium instead of pepto bismol- no more than 2 in 24 hours period as this may constipate you Avoid taking the meloxicam and tramadol over the next week Try 1000 mg Tylenol every 8 hours as needed for pain  RTC as needed or if symptoms persist or worsen

## 2014-02-15 NOTE — Progress Notes (Signed)
Pre visit review using our clinic review tool, if applicable. No additional management support is needed unless otherwise documented below in the visit note. 

## 2014-02-26 ENCOUNTER — Other Ambulatory Visit: Payer: BC Managed Care – PPO

## 2014-07-15 ENCOUNTER — Other Ambulatory Visit: Payer: Self-pay | Admitting: Internal Medicine

## 2014-07-15 ENCOUNTER — Ambulatory Visit
Admission: RE | Admit: 2014-07-15 | Discharge: 2014-07-15 | Disposition: A | Discharge status: Home / Self Care | Payer: BLUE CROSS/BLUE SHIELD | Source: Ambulatory Visit | Attending: Internal Medicine | Admitting: Internal Medicine

## 2014-07-15 ENCOUNTER — Ambulatory Visit: Payer: Self-pay

## 2014-07-15 DIAGNOSIS — Z1239 Encounter for other screening for malignant neoplasm of breast: Secondary | ICD-10-CM

## 2014-07-15 DIAGNOSIS — Z1231 Encounter for screening mammogram for malignant neoplasm of breast: Secondary | ICD-10-CM

## 2014-08-20 ENCOUNTER — Other Ambulatory Visit (INDEPENDENT_AMBULATORY_CARE_PROVIDER_SITE_OTHER): Payer: BLUE CROSS/BLUE SHIELD

## 2014-08-20 ENCOUNTER — Ambulatory Visit
Admission: RE | Admit: 2014-08-20 | Discharge: 2014-08-20 | Disposition: A | Discharge status: Home / Self Care | Payer: BLUE CROSS/BLUE SHIELD | Source: Ambulatory Visit | Attending: Internal Medicine | Admitting: Internal Medicine

## 2014-08-20 ENCOUNTER — Ambulatory Visit (INDEPENDENT_AMBULATORY_CARE_PROVIDER_SITE_OTHER): Payer: BLUE CROSS/BLUE SHIELD | Admitting: Nurse Practitioner

## 2014-08-20 ENCOUNTER — Encounter: Payer: Self-pay | Admitting: Nurse Practitioner

## 2014-08-20 VITALS — BP 106/68 | HR 74 | Temp 97.9°F | Resp 14 | Ht 64.0 in | Wt 151.0 lb

## 2014-08-20 DIAGNOSIS — M722 Plantar fascial fibromatosis: Secondary | ICD-10-CM

## 2014-08-20 DIAGNOSIS — R635 Abnormal weight gain: Secondary | ICD-10-CM

## 2014-08-20 DIAGNOSIS — R5383 Other fatigue: Secondary | ICD-10-CM

## 2014-08-20 DIAGNOSIS — Z1231 Encounter for screening mammogram for malignant neoplasm of breast: Secondary | ICD-10-CM

## 2014-08-20 DIAGNOSIS — R5381 Other malaise: Secondary | ICD-10-CM

## 2014-08-20 LAB — CBC WITH DIFFERENTIAL/PLATELET
BASOS PCT: 0.5 % (ref 0.0–3.0)
Basophils Absolute: 0 10*3/uL (ref 0.0–0.1)
Eosinophils Absolute: 0.1 10*3/uL (ref 0.0–0.7)
Eosinophils Relative: 2.2 % (ref 0.0–5.0)
HEMATOCRIT: 36.6 % (ref 36.0–46.0)
HEMOGLOBIN: 12.7 g/dL (ref 12.0–15.0)
LYMPHS PCT: 34.2 % (ref 12.0–46.0)
Lymphs Abs: 1.9 10*3/uL (ref 0.7–4.0)
MCHC: 34.7 g/dL (ref 30.0–36.0)
MCV: 96.3 fl (ref 78.0–100.0)
MONO ABS: 0.7 10*3/uL (ref 0.1–1.0)
Monocytes Relative: 12.2 % — ABNORMAL HIGH (ref 3.0–12.0)
NEUTROS ABS: 2.9 10*3/uL (ref 1.4–7.7)
Neutrophils Relative %: 50.9 % (ref 43.0–77.0)
Platelets: 224 10*3/uL (ref 150.0–400.0)
RBC: 3.8 Mil/uL — AB (ref 3.87–5.11)
RDW: 12.6 % (ref 11.5–15.5)
WBC: 5.7 10*3/uL (ref 4.0–10.5)

## 2014-08-20 LAB — COMPREHENSIVE METABOLIC PANEL WITH GFR
ALT: 9 U/L (ref 0–35)
AST: 13 U/L (ref 0–37)
Albumin: 4.1 g/dL (ref 3.5–5.2)
Alkaline Phosphatase: 51 U/L (ref 39–117)
BUN: 17 mg/dL (ref 6–23)
CO2: 27 meq/L (ref 19–32)
Calcium: 9 mg/dL (ref 8.4–10.5)
Chloride: 104 meq/L (ref 96–112)
Creatinine, Ser: 0.75 mg/dL (ref 0.40–1.20)
GFR: 89.53 mL/min
Glucose, Bld: 103 mg/dL — ABNORMAL HIGH (ref 70–99)
Potassium: 5.1 meq/L (ref 3.5–5.1)
Sodium: 135 meq/L (ref 135–145)
Total Bilirubin: 0.5 mg/dL (ref 0.2–1.2)
Total Protein: 6.6 g/dL (ref 6.0–8.3)

## 2014-08-20 LAB — LIPID PANEL
Cholesterol: 170 mg/dL (ref 0–200)
HDL: 75.8 mg/dL
LDL Cholesterol: 87 mg/dL (ref 0–99)
NonHDL: 94.2
Total CHOL/HDL Ratio: 2
Triglycerides: 37 mg/dL (ref 0.0–149.0)
VLDL: 7.4 mg/dL (ref 0.0–40.0)

## 2014-08-20 LAB — TSH: TSH: 2.28 u[IU]/mL (ref 0.35–4.50)

## 2014-08-20 NOTE — Assessment & Plan Note (Signed)
Pt was just wanting to check on her feet and the bones being more noticeable. She was concerned about her Grandmother's feet being deformed and hers possibly heading that direction. She denies pain or other issues and was considering a referral to podiatry. After discussing further she will contact her insurance company to discuss how much it would be. She will let us know if changes or would like a referral. Will follow.

## 2014-08-20 NOTE — Progress Notes (Signed)
   Subjective:    Patient ID: Erin Boone, female    DOB: 30-Apr-1971, 44 y.o.   MRN: [id number]  HPI  Erin Boone is a 44 yo female with a CC of right and left foot bone protrusion.   1) Erin Boone is a 44 yo female with a CC of right foot lateral bone protrusion. Left foot medial bone protrusion. No pain, just wanted it to be looked at. She describes wearing flats in past and now wearing heels more often due to change of jobs. She has had plantar facitis in her left foot that is currently resolved.   Review of Systems  Musculoskeletal: Negative for myalgias, back pain, joint swelling, arthralgias, gait problem, neck pain and neck stiffness.      Objective:   Physical Exam  Constitutional:  BP 106/68 mmHg  Pulse 74  Temp(Src) 97.9 F (36.6 C) (Oral)  Resp 14  Ht 5\' 4"  (1.626 m)  Wt 151 lb (68.493 kg)  BMI 25.91 kg/m2  SpO2 97%  LMP  (Approximate)   Musculoskeletal:       Feet:      Assessment & Plan:

## 2014-08-20 NOTE — Progress Notes (Signed)
Pre visit review using our clinic review tool, if applicable. No additional management support is needed unless otherwise documented below in the visit note. 

## 2014-08-20 NOTE — Patient Instructions (Signed)
Call or MyChart me if you would like a referral to podiatry. Let us know if anything becomes painful.

## 2014-08-22 ENCOUNTER — Encounter: Payer: Self-pay | Admitting: Internal Medicine

## 2016-02-03 IMAGING — CR DG LUMBAR SPINE COMPLETE 4+V
4 series · 4 of 4 positions shown · non-contrast
Comparison: None.

CLINICAL DATA: Chronic left-sided back pain

EXAM:
LUMBAR SPINE - COMPLETE 4+ VIEW

[view not recorded (1 of 4)]
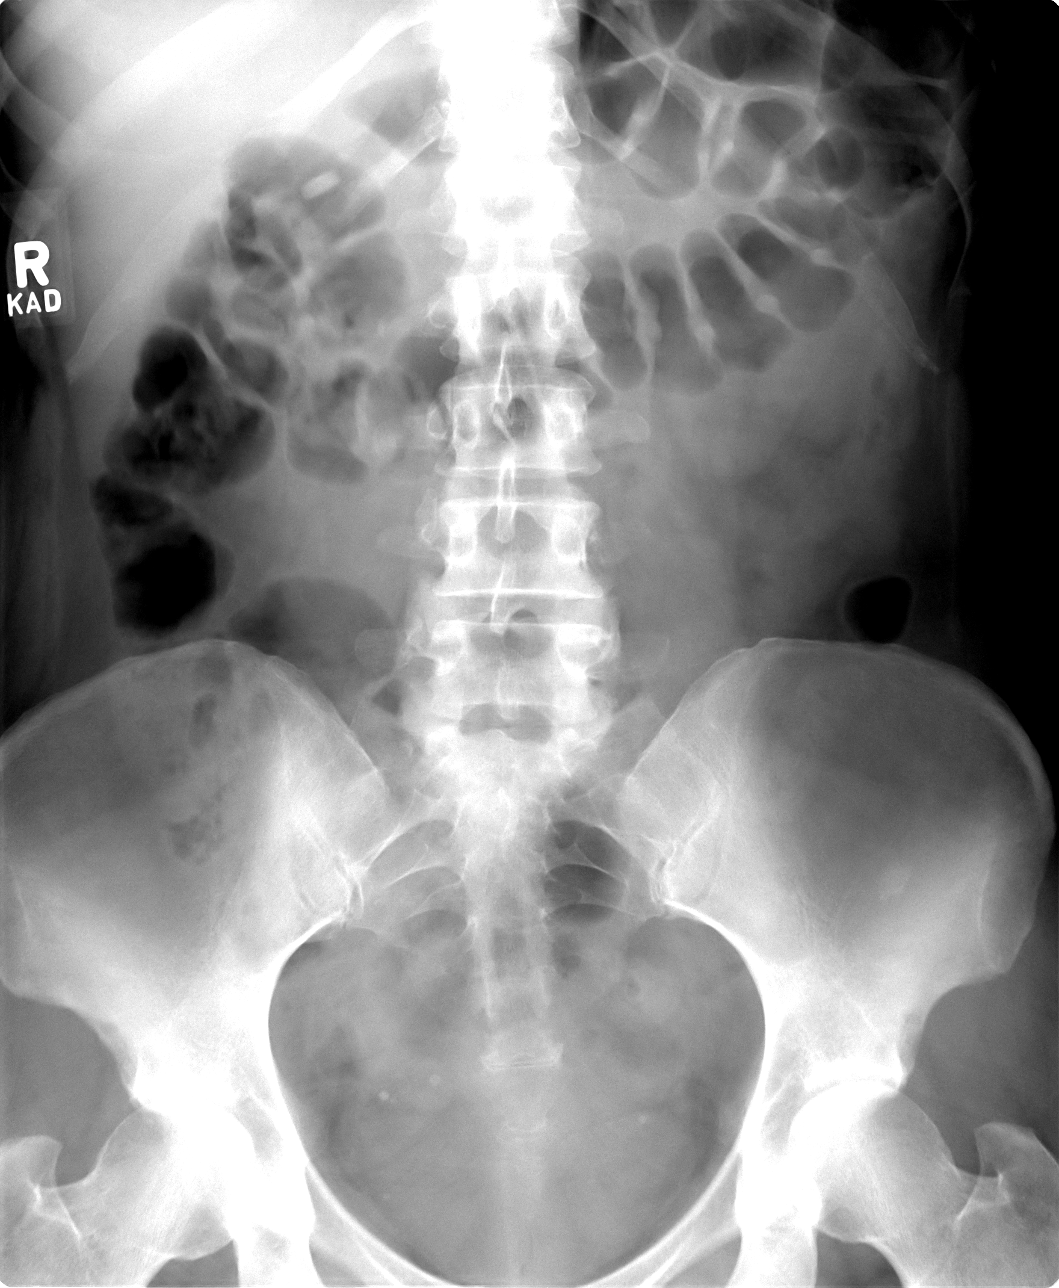

[view not recorded (2 of 4)]
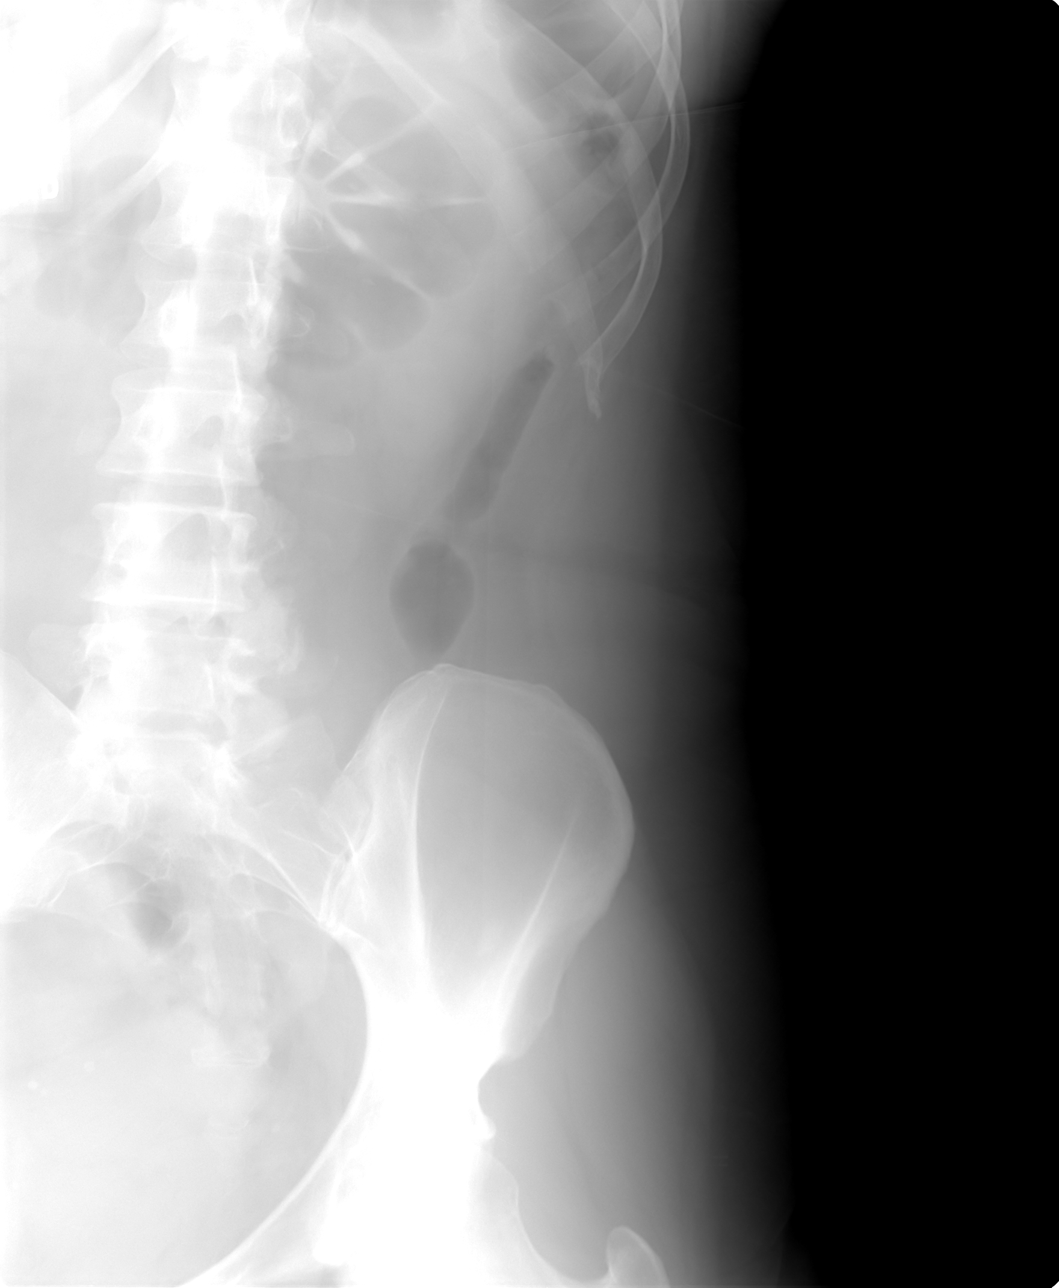

[view not recorded (3 of 4)]
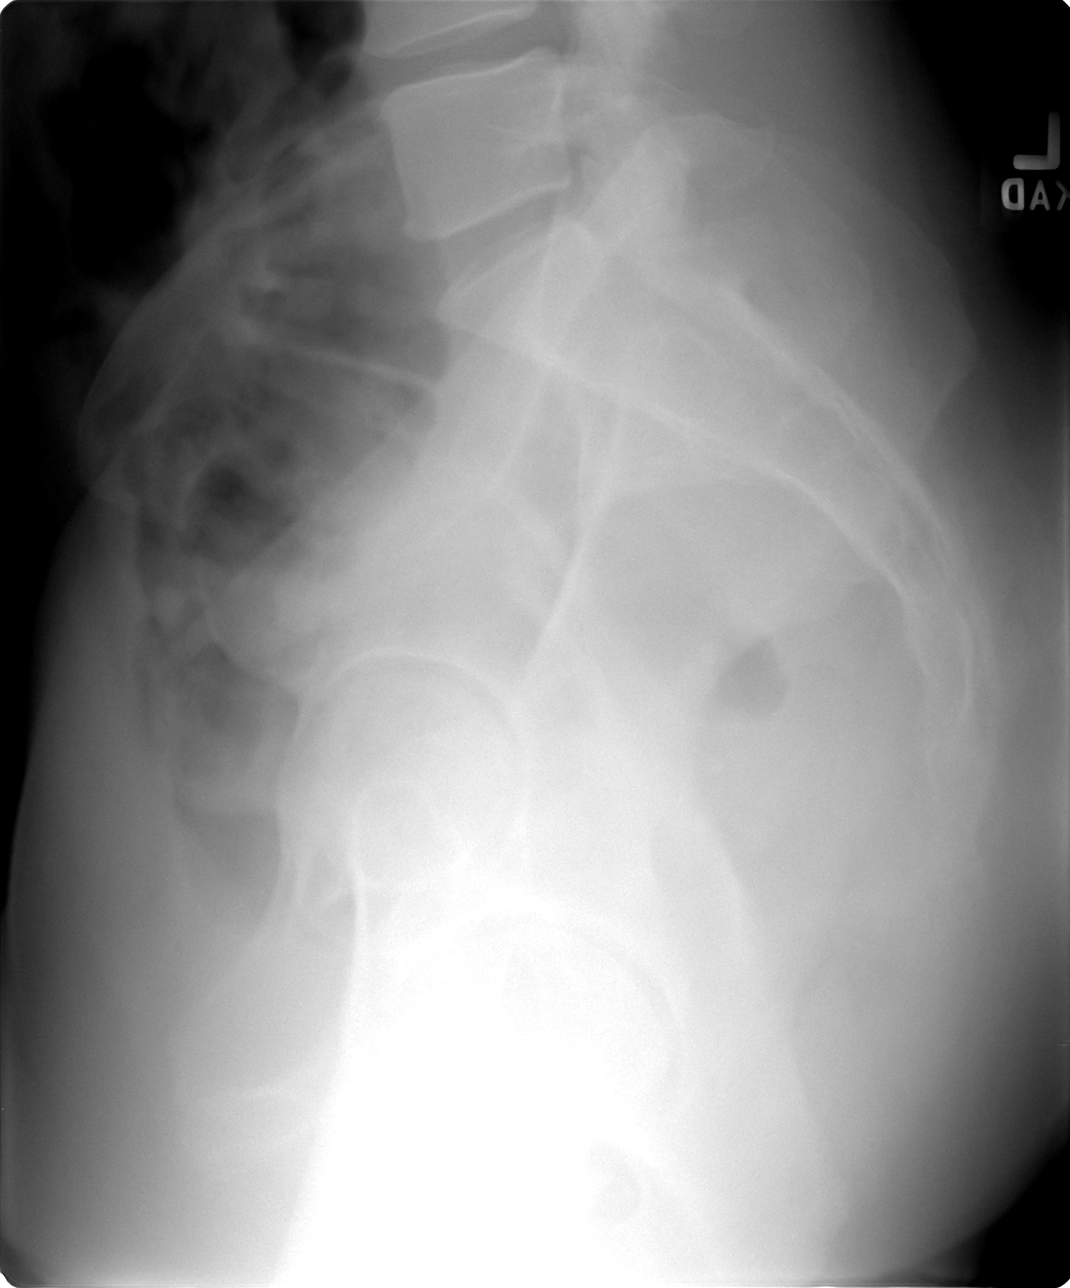

[view not recorded (4 of 4)]
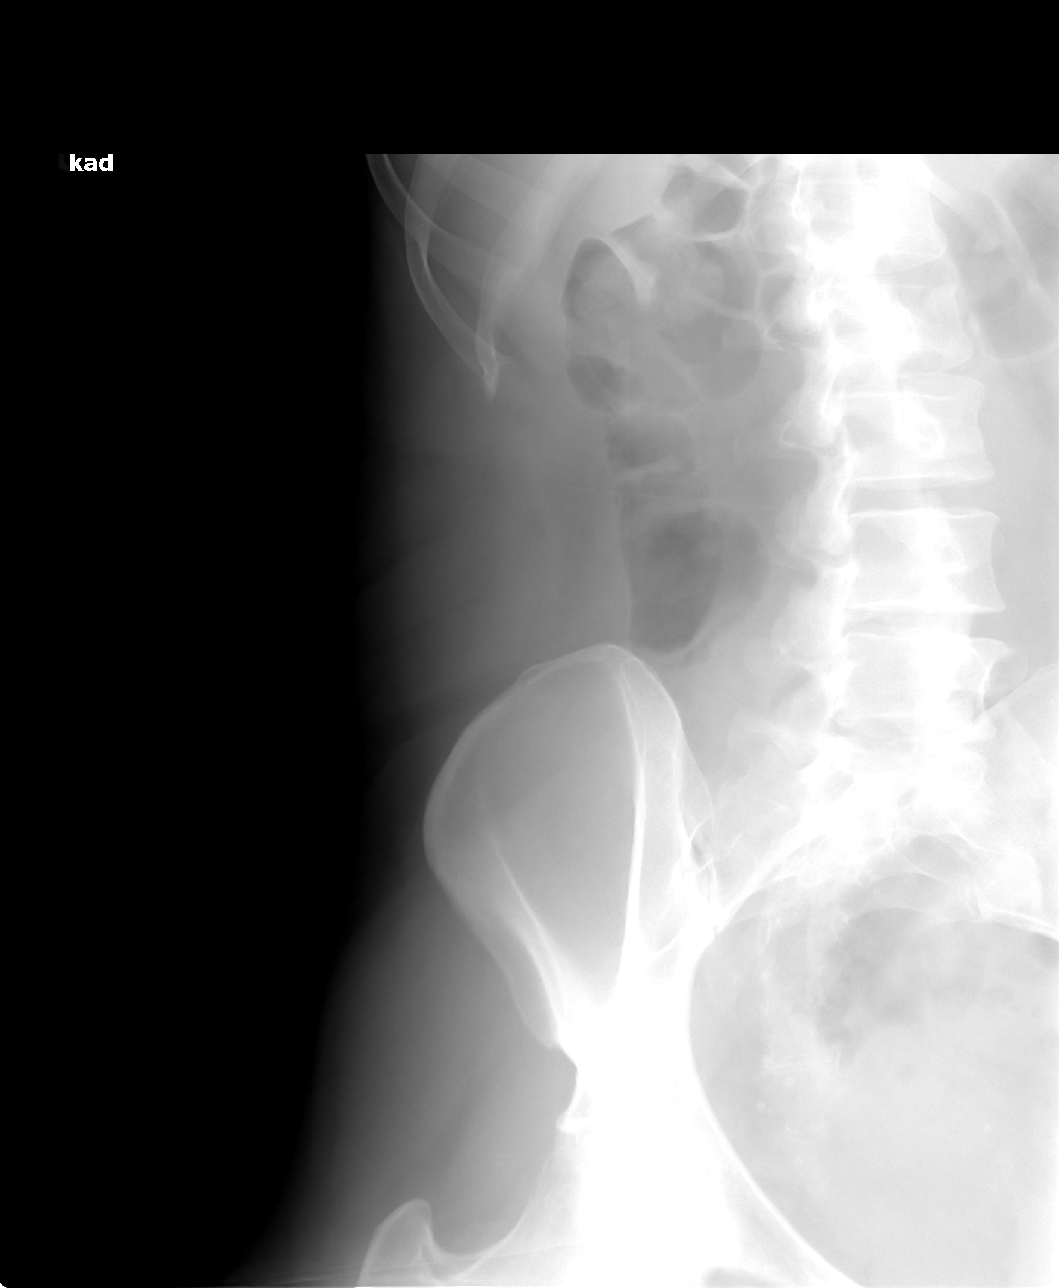

[4 of 4 positions shown; findings below may reference images not displayed]

FINDINGS: Discoid opacity right upper quadrant of the abdomen could represent
clips from cholecystectomy with some motion artifact. Correlate with
history. A gallstone could also have this appearance. There is
normal anterior-posterior alignment. There is mild L4-5 degenerative
disc disease. No fracture identified. There is evidence of L4-5
facet arthropathy.
IMPRESSION: No acute findings.

## 2016-09-17 ENCOUNTER — Ambulatory Visit (INDEPENDENT_AMBULATORY_CARE_PROVIDER_SITE_OTHER): Payer: BLUE CROSS/BLUE SHIELD | Admitting: Internal Medicine

## 2016-09-17 ENCOUNTER — Encounter: Payer: Self-pay | Admitting: Internal Medicine

## 2016-09-17 VITALS — BP 104/70 | HR 81 | Temp 97.8°F | Resp 12 | Ht 64.0 in | Wt 145.5 lb

## 2016-09-17 DIAGNOSIS — G8929 Other chronic pain: Secondary | ICD-10-CM | POA: Diagnosis not present

## 2016-09-17 DIAGNOSIS — F988 Other specified behavioral and emotional disorders with onset usually occurring in childhood and adolescence: Secondary | ICD-10-CM

## 2016-09-17 DIAGNOSIS — E7800 Pure hypercholesterolemia, unspecified: Secondary | ICD-10-CM

## 2016-09-17 DIAGNOSIS — E559 Vitamin D deficiency, unspecified: Secondary | ICD-10-CM | POA: Diagnosis not present

## 2016-09-17 DIAGNOSIS — R5383 Other fatigue: Secondary | ICD-10-CM | POA: Diagnosis not present

## 2016-09-17 DIAGNOSIS — M545 Low back pain: Secondary | ICD-10-CM

## 2016-09-17 LAB — CBC WITH DIFFERENTIAL/PLATELET
BASOS ABS: 0.2 10*3/uL — AB (ref 0.0–0.1)
BASOS PCT: 3.5 % — AB (ref 0.0–3.0)
EOS ABS: 0.1 10*3/uL (ref 0.0–0.7)
Eosinophils Relative: 2 % (ref 0.0–5.0)
HEMATOCRIT: 39.9 % (ref 36.0–46.0)
Hemoglobin: 13.5 g/dL (ref 12.0–15.0)
LYMPHS ABS: 2 10*3/uL (ref 0.7–4.0)
Lymphocytes Relative: 30.9 % (ref 12.0–46.0)
MCHC: 34 g/dL (ref 30.0–36.0)
MCV: 98.8 fl (ref 78.0–100.0)
MONOS PCT: 10.9 % (ref 3.0–12.0)
Monocytes Absolute: 0.7 10*3/uL (ref 0.1–1.0)
NEUTROS ABS: 3.3 10*3/uL (ref 1.4–7.7)
Neutrophils Relative %: 52.7 % (ref 43.0–77.0)
PLATELETS: 241 10*3/uL (ref 150.0–400.0)
RBC: 4.04 Mil/uL (ref 3.87–5.11)
RDW: 12.1 % (ref 11.5–15.5)
WBC: 6.3 10*3/uL (ref 4.0–10.5)

## 2016-09-17 LAB — COMPREHENSIVE METABOLIC PANEL
ALBUMIN: 4.5 g/dL (ref 3.5–5.2)
ALK PHOS: 46 U/L (ref 39–117)
ALT: 11 U/L (ref 0–35)
AST: 17 U/L (ref 0–37)
BUN: 11 mg/dL (ref 6–23)
CALCIUM: 9.5 mg/dL (ref 8.4–10.5)
CO2: 29 mEq/L (ref 19–32)
Chloride: 101 mEq/L (ref 96–112)
Creatinine, Ser: 0.72 mg/dL (ref 0.40–1.20)
GFR: 92.96 mL/min (ref >60.00)
Glucose, Bld: 86 mg/dL (ref 70–99)
POTASSIUM: 4.5 meq/L (ref 3.5–5.1)
Sodium: 137 mEq/L (ref 135–145)
TOTAL PROTEIN: 7.1 g/dL (ref 6.0–8.3)
Total Bilirubin: 0.7 mg/dL (ref 0.2–1.2)

## 2016-09-17 LAB — LIPID PANEL
CHOL/HDL RATIO: 2
Cholesterol: 188 mg/dL (ref 0–200)
HDL: 77.8 mg/dL (ref >39.00)
LDL CALC: 103 mg/dL — AB (ref 0–99)
NonHDL: 110.12
TRIGLYCERIDES: 37 mg/dL (ref 0.0–149.0)
VLDL: 7.4 mg/dL (ref 0.0–40.0)

## 2016-09-17 LAB — TSH: TSH: 1.37 u[IU]/mL (ref 0.35–4.50)

## 2016-09-17 LAB — VITAMIN D 25 HYDROXY (VIT D DEFICIENCY, FRACTURES): VITD: 23.41 ng/mL — AB (ref 30.00–100.00)

## 2016-09-17 MED ORDER — TRAMADOL HCL 50 MG PO TABS: 50.0000 mg | ORAL_TABLET | Freq: Three times a day (TID) | ORAL | 1 refills | Status: DC | PRN

## 2016-09-17 MED ORDER — BUPROPION HCL 75 MG PO TABS: 75.0000 mg | ORAL_TABLET | Freq: Two times a day (BID) | ORAL | 0 refills | Status: DC

## 2016-09-17 MED ORDER — CELECOXIB 200 MG PO CAPS: 200.0000 mg | ORAL_CAPSULE | Freq: Two times a day (BID) | ORAL | 1 refills | Status: DC

## 2016-09-17 NOTE — Patient Instructions (Signed)
Starting wellbutrin 75 mg twice daily for mood/concentration issues.  Take with food   Take 2nd dose by 3 pm to avoid insomnia  For your back pain   Starting celebrex 200 mg daily as your anti inflammatory (instead of aleve or motrin)  Tylenol 1000 mg twice daily  Tramadol : use for moderate to severe pain,  Up to 3 daily

## 2016-09-17 NOTE — Progress Notes (Signed)
Pre-visit discussion using our clinic review tool. No additional management support is needed unless otherwise documented below in the visit note.  

## 2016-09-17 NOTE — Progress Notes (Signed)
Subjective:  Patient ID: Erin Boone, female    DOB: April 18, 1971  Age: 46 y.o. MRN: [id number]  CC: The primary encounter diagnosis was Other fatigue. Diagnoses of Pure hypercholesterolemia, Vitamin D deficiency, Chronic midline low back pain, with sciatica presence unspecified, and ADD (attention deficit disorder) without hyperactivity were also pertinent to this visit.  HPI: Erin Boone presents for evaluation of multiple issues.  She was last seen in [place] 2015.   1) Anxiety and decreased concentration .  Secondary to life changes:  Separated from husband [name] in October.  Husband is an alcoholic.  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   Patient feels overwhelmed  History  of ADD was diagnosed during college years and she was  prescribed 30 mg of adderall which she took throughout college .  1) back pain   Chronic , improved with weight loss in 2015,  But recenlty moved and did a lot of lifting wihout using her legs.  Tried aleve and motrin Used ex husband's tramadol   And muscle relaxers.  Aggravated  by lifting her dog in and out of her car   3) sees [facility] for birth control,  Pap smear and mammogram (both normal)  Outpatient Medications Prior to Visit  Medication Sig Dispense Refill  . cyanocobalamin 1000 MCG tablet Take 100 mcg by mouth daily.    . podofilox (CONDYLOX) 0.5 % external solution Apply topically 2 (two) times daily. For three days,  Then stop.,  May repeat weekly x 4 (Patient not taking: Reported on 09/17/2016) 3.5 mL 0   No facility-administered medications prior to visit.     Review of Systems;  Patient denies headache, fevers, malaise, unintentional weight loss, skin rash, eye pain, sinus congestion and sinus pain, sore throat, dysphagia,  hemoptysis , cough, dyspnea, wheezing, chest pain, palpitations, orthopnea,  edema, abdominal pain, nausea, melena, diarrhea, constipation, flank pain, dysuria, hematuria, urinary  Frequency, nocturia, numbness, tingling, seizures,  Focal weakness, Loss of consciousness,  Tremor, insomnia, depression, anxiety, and suicidal ideation.      Objective:  BP 104/70   Pulse 81   Temp 97.8 F (36.6 C) (Oral)   Resp 12   Ht 5\' 4"  (1.626 m)   Wt 145 lb 8 oz (66 kg)   SpO2 98%   BMI 24.98 kg/m   BP Readings from Last 3 Encounters:  09/17/16 104/70  08/20/14 106/68  02/15/14 108/66    Wt Readings from Last 3 Encounters:  09/17/16 145 lb 8 oz (66 kg)  08/20/14 151 lb (68.5 kg)  02/15/14 162 lb (73.5 kg)    General appearance: alert, cooperative and appears stated age Ears: normal TM's and external ear canals both ears Throat: lips, mucosa, and tongue normal; teeth and gums normal Neck: no adenopathy, no carotid bruit, supple, symmetrical, trachea midline and thyroid not enlarged, symmetric, no tenderness/mass/nodules Back: symmetric, no curvature. ROM normal. No CVA tenderness. Lungs: clear to auscultation bilaterally Heart: regular rate and rhythm, S1, S2 normal, no murmur, click, rub or gallop Abdomen: soft, non-tender; bowel sounds normal; no masses,  no organomegaly Pulses: 2+ and symmetric Skin: Skin color, texture, turgor normal. No rashes or lesions Lymph nodes: Cervical, supraclavicular, and axillary nodes normal.  No results found for: HGBA1C  Lab Results  Component Value Date   CREATININE 0.72 09/17/2016  CREATININE 0.75 08/20/2014   CREATININE 0.7 12/30/2012    Lab Results  Component Value Date   WBC 6.3 09/17/2016   HGB 13.5 09/17/2016   HCT 39.9 09/17/2016   PLT 241.0 09/17/2016   GLUCOSE 86 09/17/2016   CHOL 188 09/17/2016   TRIG 37.0 09/17/2016   HDL 77.80 09/17/2016   LDLCALC 103 (H) 09/17/2016   ALT 11 09/17/2016   AST 17 09/17/2016   NA 137 09/17/2016   K 4.5 09/17/2016   CL 101 09/17/2016   CREATININE 0.72 09/17/2016    BUN 11 09/17/2016   CO2 29 09/17/2016   TSH 1.37 09/17/2016    Mm Digital Screening Bilateral  Result Date: 08/20/2014 CLINICAL DATA:  Screening. EXAM: DIGITAL SCREENING BILATERAL MAMMOGRAM WITH CAD COMPARISON:  None. ACR Breast Density Category c: The breast tissue is heterogeneously dense, which may obscure small masses FINDINGS: There are no findings suspicious for malignancy. Images were processed with CAD. IMPRESSION: No mammographic evidence of malignancy. A result letter of this screening mammogram will be mailed directly to the patient. RECOMMENDATION: Screening mammogram in one year. (Code:SM-B-01Y) BI-RADS CATEGORY  1: Negative. Electronically Signed   By: Altamese Cabal M.D.   On: 08/23/2014 08:19    Assessment & Plan:   Problem List Items Addressed This Visit    ADD (attention deficit disorder) without hyperactivity    Diagnosed during college years,  Previously treated,  current issues aggravated by life stressors. Some depressive syptoms, inckuding fatigue,  Screening labs normal.  Trial of wellbutrin       Chronic low back pain    Exam negative for sciatica. Adding celebrex and tramadol.       Relevant Medications   celecoxib (CELEBREX) 200 MG capsule   traMADol (ULTRAM) 50 MG tablet    Other Visit Diagnoses    Other fatigue    -  Primary   Relevant Orders   Comprehensive metabolic panel (Completed)   TSH (Completed)   CBC with Differential/Platelet (Completed)   Pure hypercholesterolemia       Relevant Orders   Lipid panel (Completed)   Vitamin D deficiency       Relevant Orders   VITAMIN D 25 Hydroxy (Vit-D Deficiency, Fractures) (Completed)      I am having Ms. Mattos start on buPROPion, celecoxib, traMADol, and ergocalciferol. I am also having her maintain her podofilox, cyanocobalamin, and Levonorgestrel-Ethinyl Estradiol.  Meds ordered this encounter  Medications  . Levonorgestrel-Ethinyl Estradiol (AMETHIA,CAMRESE) 0.1-0.02 & 0.01 MG tablet    Sig:  Take 1 tablet by mouth daily.   Marland Kitchen buPROPion (WELLBUTRIN) 75 MG tablet    Sig: Take 1 tablet (75 mg total) by mouth 2 (two) times daily.    Dispense:  60 tablet    Refill:  0  . celecoxib (CELEBREX) 200 MG capsule    Sig: Take 1 capsule (200 mg total) by mouth 2 (two) times daily.    Dispense:  30 capsule    Refill:  1  . traMADol (ULTRAM) 50 MG tablet    Sig: Take 1 tablet (50 mg total) by mouth every 8 (eight) hours as needed.    Dispense:  90 tablet    Refill:  1  . ergocalciferol (DRISDOL) 50000 units capsule    Sig: Take 1 capsule (50,000 Units total) by mouth once a week.    Dispense:  4 capsule    Refill:  0    There are no discontinued medications.  Follow-up: No Follow-up on file.  Crecencio Mc, MD

## 2016-09-19 ENCOUNTER — Encounter: Payer: Self-pay | Admitting: Internal Medicine

## 2016-09-19 DIAGNOSIS — F988 Other specified behavioral and emotional disorders with onset usually occurring in childhood and adolescence: Secondary | ICD-10-CM | POA: Insufficient documentation

## 2016-09-19 HISTORY — DX: Other specified behavioral and emotional disorders with onset usually occurring in childhood and adolescence: F98.8

## 2016-09-19 MED ORDER — ERGOCALCIFEROL 1.25 MG (50000 UT) PO CAPS: 50000.0000 [IU] | ORAL_CAPSULE | ORAL | 0 refills | Status: DC

## 2016-09-19 NOTE — Assessment & Plan Note (Addendum)
Diagnosed during college years,  Previously treated,  current issues aggravated by life stressors. Some depressive syptoms, inckuding fatigue,  Screening labs normal.  Trial of wellbutrin

## 2016-09-19 NOTE — Assessment & Plan Note (Signed)
Exam negative for sciatica. Adding celebrex and tramadol.

## 2016-10-14 ENCOUNTER — Other Ambulatory Visit: Payer: Self-pay | Admitting: Internal Medicine

## 2016-10-22 ENCOUNTER — Other Ambulatory Visit: Payer: Self-pay | Admitting: Internal Medicine

## 2016-11-02 ENCOUNTER — Other Ambulatory Visit: Payer: Self-pay | Admitting: Internal Medicine

## 2016-11-05 ENCOUNTER — Other Ambulatory Visit: Payer: Self-pay | Admitting: Internal Medicine

## 2016-11-14 ENCOUNTER — Other Ambulatory Visit: Payer: Self-pay

## 2016-11-14 MED ORDER — BUPROPION HCL 75 MG PO TABS: 75.0000 mg | ORAL_TABLET | Freq: Two times a day (BID) | ORAL | 0 refills | Status: DC

## 2017-08-08 DIAGNOSIS — S61200A Unspecified open wound of right index finger without damage to nail, initial encounter: Secondary | ICD-10-CM | POA: Diagnosis not present

## 2017-08-26 DIAGNOSIS — Z3202 Encounter for pregnancy test, result negative: Secondary | ICD-10-CM | POA: Diagnosis not present

## 2018-02-20 DIAGNOSIS — Z01419 Encounter for gynecological examination (general) (routine) without abnormal findings: Secondary | ICD-10-CM | POA: Diagnosis not present

## 2018-02-20 DIAGNOSIS — Z6826 Body mass index (BMI) 26.0-26.9, adult: Secondary | ICD-10-CM | POA: Diagnosis not present

## 2018-02-20 LAB — HM PAP SMEAR

## 2018-02-24 ENCOUNTER — Encounter: Payer: Self-pay | Admitting: Internal Medicine

## 2018-03-24 ENCOUNTER — Ambulatory Visit: Payer: 59 | Admitting: Mental Health

## 2018-03-24 DIAGNOSIS — F4323 Adjustment disorder with mixed anxiety and depressed mood: Secondary | ICD-10-CM

## 2018-03-24 NOTE — Progress Notes (Signed)
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 She is living w/ her boyfriend who uses "drinks a lot". She stated he does not talk openly, he is often insecure, accuses her of cheating / lying. She leaves her computer / phone unlocked just to show transparency. She stated he lost his father at age 62 as his father shot himself and he found him after. She stated she thinks he is AD/HD. She stated he often insecure, asks her if another man looks better than him or if others, such as family, are upset w/ him. She stated he stays up late, until 4am, drinks 12 beers most nights.  When drinking, he calls her names, verbally abusive; brings up the past.  Her father is in prison and is soon to be released and is to live w/ her.  She got divorced last year after her marriage of 19 years. She has some anxiety about her father coming to live w/ them as her bf will have to make changes,    Mental Status Exam:   Appearance:   Casual     Behavior:  Appropriate  Motor:  Normal  Speech/Language:   Normal Rate  Affect:  Appropriate  Mood:  depressed  Thought process:  Coherent and Relevant  Thought content:    WDL and Logical  Perceptual disturbances:    Normal  Orientation:  Full (Time, Place, and Person)  Attention:  Good  Concentration:  good  Memory:  Immediate  Fund of knowledge:   Good  Insight:    Good  Judgment:   Good  Impulse Control:  good    Reported Symptoms:    Risk Assessment: Danger to Self:  Noreviewed Self-injurious Behavior: No Danger to Others: No Duty to Warn:no Physical Aggression / Violence:No  Access to Firearms a concern: No  Gang Involvement:No  Patient / guardian was educated about steps to take if suicide or homicide  risk level increases between visits: no While future psychiatric events cannot be accurately predicted, the patient does not currently require acute inpatient psychiatric care and does not currently meet [facility] involuntary commitment criteria.  Substance Abuse History: Current substance abuse: No     Past Psychiatric History:   No previous psychological problems have been observed Outpatient Providers:  History of Psych Hospitalization: No  Psychological Testing: none   Medical History/Surgical History: Past Medical History:  Diagnosis Date  . Genital warts    Past Surgical History:  Procedure Laterality Date  . EYE SURGERY    . TONSILLECTOMY AND ADENOIDECTOMY Bilateral 1987    Medications: Current Outpatient Medications  Medication Sig Dispense Refill  . buPROPion (WELLBUTRIN) 75 MG tablet Take 1 tablet (75 mg total) by mouth 2 (two) times daily. 180 tablet 0  . celecoxib (CELEBREX) 200 MG capsule TAKE ONE CAPSULE BY MOUTH TWICE A DAY 30 capsule 1  . cyanocobalamin 1000 MCG tablet Take 100 mcg by mouth daily.    . ergocalciferol (DRISDOL) 50000 units capsule Take 1 capsule (50,000 Units total) by mouth once a week. 4 capsule 0  . Levonorgestrel-Ethinyl Estradiol (AMETHIA,CAMRESE) 0.1-0.02 & 0.01 MG tablet Take 1 tablet by mouth daily.     . podofilox (CONDYLOX) 0.5 % external solution Apply topically 2 (two) times daily.  For three days,  Then stop.,  May repeat weekly x 4 (Patient not taking: Reported on 09/17/2016) 3.5 mL 0  . traMADol (ULTRAM) 50 MG tablet Take 1 tablet (50 mg total) by mouth every 8 (eight) hours as needed. 90 tablet 1   No current facility-administered medications for this visit.    No Known Allergies  Diagnoses:  No diagnosis found.     ADULT PSYCHOSOCIAL ASSESSMENT Part II Abuse History: Victim of No., emotional   Report needed: No. Victim of Neglect:No. Perpetrator: none Witness / Exposure to Domestic Violence: No   Protective  Services Involvement: No  Witness to [employer] Violence:  No   Family History:  Family History  Problem Relation Age of Onset  . Cancer Mother   . Hypertension Mother   . Dementia Mother   . Arthritis Father   . Hypertension Father   . Diabetes Sister   . Diabetes Paternal Aunt   . Diabetes Paternal Uncle   . Heart disease Maternal Grandfather   . Diabetes Paternal Grandmother   . Diabetes Paternal Grandfather     Living situation: the patient lives with their family  Sexual Orientation:  Straight  Relationship Status: divorced  Name of spouse / other:  Heath Lark             If a parent, number of children / ages:    Avinger; significant other friends  Financial Stress:  No   Income/Employment/Disability: Employment Civil Service fast streamer: No   Educational History: Education: some college  Environmental consultant:   none stated   Any cultural differences that may affect / interfere with treatment:  none  Recreation/Hobbies:   Stressors:  Relationship stress  Strengths:  Supportive Relationships, Friends and Hopefulness  Barriers:  none  Legal History: Pending legal issue / charges: The patient has no significant history of legal issues. History of legal issue / charges: none   [place], [facility]

## 2018-04-02 ENCOUNTER — Encounter: Payer: Self-pay | Admitting: Gastroenterology

## 2018-04-11 ENCOUNTER — Ambulatory Visit: Payer: BLUE CROSS/BLUE SHIELD | Admitting: Internal Medicine

## 2018-04-11 ENCOUNTER — Encounter: Payer: Self-pay | Admitting: Internal Medicine

## 2018-04-11 VITALS — BP 116/66 | HR 67 | Ht 64.0 in | Wt 159.4 lb

## 2018-04-11 DIAGNOSIS — K921 Melena: Secondary | ICD-10-CM | POA: Diagnosis not present

## 2018-04-11 DIAGNOSIS — D485 Neoplasm of uncertain behavior of skin: Secondary | ICD-10-CM | POA: Diagnosis not present

## 2018-04-11 DIAGNOSIS — L814 Other melanin hyperpigmentation: Secondary | ICD-10-CM | POA: Diagnosis not present

## 2018-04-11 DIAGNOSIS — L821 Other seborrheic keratosis: Secondary | ICD-10-CM | POA: Diagnosis not present

## 2018-04-11 DIAGNOSIS — L309 Dermatitis, unspecified: Secondary | ICD-10-CM | POA: Diagnosis not present

## 2018-04-11 DIAGNOSIS — D225 Melanocytic nevi of trunk: Secondary | ICD-10-CM | POA: Diagnosis not present

## 2018-04-11 NOTE — Patient Instructions (Signed)

## 2018-04-11 NOTE — Progress Notes (Signed)
   Erin Boone 47 y.o. 1970/10/25 [id number] Referred by: Brien Few, MD  Assessment & Plan:   Encounter Diagnosis  Name Primary?  . Blood in stool Yes    Needs an evaluation with a colonoscopy.The risks and benefits as well as alternatives of endoscopic procedure(s) have been discussed and reviewed. All questions answered. The patient agrees to proceed.   I appreciate the opportunity to care for this patient. I will send a copy to Dr. Brien Few  Subjective:   Chief Complaint: Blood in stool  HPI Patient is here with a 2 to 68-month history of blood mixed in with her bowel movement.  Blood in the stool.  She saw Dr. Rutherford Limerick and when he did her pelvic exam he said he did not palpate any hemorrhoids or note any of those and suggested she get a GI evaluation.  She does not have any signs or symptoms of hemorrhoids other than the bleeding and again says the bleeding though it is bright red seems to be in the stool.  I reviewed the gynecologic evaluation from 02/21/2018.  There is no change in bowel habits constipation diarrhea, and GI review of systems otherwise negative No Known Allergies Current Meds  Medication Sig  . Biotin 1 MG CAPS Take 1 capsule by mouth daily.  . Levonorgestrel-Ethinyl Estradiol (AMETHIA,CAMRESE) 0.1-0.02 & 0.01 MG tablet Take 1 tablet by mouth daily.    Past Medical History:  Diagnosis Date  . Genital warts   . Plantar fasciitis   . Vitamin D deficiency    Past Surgical History:  Procedure Laterality Date  . EYE SURGERY    . 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   Review of Systems See HPI.  Does have some anxiety symptoms at time intermittent headaches menstrual pain muscle cramps back pain.  All other review of systems negative.  Objective:   Physical Exam @BP  116/66   Pulse 67   Ht 5\' 4"  (1.626 m)   Wt 159 lb 6.4 oz (72.3 kg)   BMI 27.36 kg/m @  General:  Well-developed, well-nourished and in no acute distress Eyes:  anicteric. ENT:   Mouth and posterior pharynx free of lesions.  Neck:   supple w/o thyromegaly or mass.  Lungs: Clear to auscultation bilaterally. Heart:   S1S2, no rubs, murmurs, gallops. Abdomen:  soft, non-tender, no hepatosplenomegaly, hernia, or mass and BS+.  Rectal: Deferred until colonoscopy Neuro:  A&O x 3.  Psych:  appropriate mood and  Affect.   Data Reviewed: See HPI

## 2018-05-14 ENCOUNTER — Encounter: Payer: Self-pay | Admitting: Internal Medicine

## 2018-05-26 ENCOUNTER — Encounter: Payer: Self-pay | Admitting: Internal Medicine

## 2018-05-26 ENCOUNTER — Ambulatory Visit (AMBULATORY_SURGERY_CENTER): Payer: 59 | Admitting: Internal Medicine

## 2018-05-26 VITALS — BP 111/69 | HR 67 | Temp 98.6°F | Resp 20 | Ht 64.0 in | Wt 159.0 lb

## 2018-05-26 DIAGNOSIS — K648 Other hemorrhoids: Secondary | ICD-10-CM | POA: Diagnosis not present

## 2018-05-26 DIAGNOSIS — K921 Melena: Secondary | ICD-10-CM | POA: Diagnosis not present

## 2018-05-26 MED ORDER — HYDROCORTISONE 2.5 % RE CREA: 1.0000 "application " | TOPICAL_CREAM | Freq: Two times a day (BID) | RECTAL | 1 refills | Status: DC | PRN

## 2018-05-26 MED ORDER — SODIUM CHLORIDE 0.9 % IV SOLN: 500.0000 mL | Freq: Once | INTRAVENOUS | Status: DC

## 2018-05-26 NOTE — Op Note (Signed)
[facility] Patient Name: Erin Boone Procedure Date: 05/26/2018 11:19 AM MRN: [id number] Endoscopist: Gatha Mayer , MD Age: 47 Referring MD:  Date of Birth: 1971-01-03 Gender: Female Account #: [id number] Procedure:                Colonoscopy Indications:              Hematochezia Medicines:                Propofol per Anesthesia, Monitored Anesthesia Care Procedure:                Pre-Anesthesia Assessment:                           - Prior to the procedure, a History and Physical                            was performed, and patient medications and                            allergies were reviewed. The patient's tolerance of                            previous anesthesia was also reviewed. The risks                            and benefits of the procedure and the sedation                            options and risks were discussed with the patient.                            All questions were answered, and informed consent                            was obtained. Prior Anticoagulants: The patient has                            taken no previous anticoagulant or antiplatelet                            agents. ASA Grade Assessment: I - A normal, healthy                            patient. After reviewing the risks and benefits,                            the patient was deemed in satisfactory condition to                            undergo the procedure.                           After obtaining informed consent, the colonoscope  was passed under direct vision. Throughout the                            procedure, the patient's blood pressure, pulse, and                            oxygen saturations were monitored continuously. The                            Colonoscope was introduced through the anus and                            advanced to the the cecum, identified by                            appendiceal orifice and ileocecal valve. The                            colonoscopy was performed without difficulty. The                            patient tolerated the procedure well. The quality                            of the bowel preparation was excellent. The bowel                            preparation used was Miralax. The appendiceal                            orifice and the rectum were photographed. Scope In: 11:24:11 AM Scope Out: 11:39:42 AM Scope Withdrawal Time: 0 hours 9 minutes 58 seconds  Total Procedure Duration: 0 hours 15 minutes 31 seconds  Findings:                 The perianal examination was normal.                           The digital rectal exam findings include palpable                            cervix anterior wall.                           External and internal hemorrhoids were found as                            well as hypertrophied anal papillae.                           The exam was otherwise without abnormality on                            direct and retroflexion views. Complications:            No immediate complications.  Estimated Blood Loss:     Estimated blood loss: none. Impression:               - Palpable (retroverted) cervix anterior wall found                            on digital rectal exam.                           - External and internal hemorrhoids and some                            hypertrophied papillae.                           - The examination was otherwise normal on direct                            and retroflexion views.                           - No specimens collected. Recommendation:           - Patient has a contact number available for                            emergencies. The signs and symptoms of potential                            delayed complications were discussed with the                            patient. Return to normal activities tomorrow.                            Written discharge instructions were provided to the                             patient.                           - Resume previous diet.                           - Continue present medications.                           - Repeat colonoscopy in 10 years for screening                            purposes.                           - Treat hemorrhoids with Anusol HC 2.5% prn                           If persistent problems despite that return to me Gatha Mayer, MD 05/26/2018 11:51:02 AM This  report has been signed electronically.

## 2018-05-26 NOTE — Patient Instructions (Addendum)
I saw inflamed hemorrhoids - no other abnormalities. No polyp and no cancer.  I have prescribed hydrocortisone cream to use to treat the bleeding hemorrhoids - may use as needed.   If bleeding becomes more problematic can consider removing hemorrhoids using hemorrhoidal banding - done in my office.  Next routine colonoscopy or other screening test in 10 years - 2029-30.  I appreciate the opportunity to care for you. Gatha Mayer, MD, FACG     YOU HAD AN ENDOSCOPIC PROCEDURE TODAY AT [facility]:   Refer to the procedure report that was given to you for any specific questions about what was found during the examination.  If the procedure report does not answer your questions, please call your gastroenterologist to clarify.  If you requested that your care partner not be given the details of your procedure findings, then the procedure report has been included in a sealed envelope for you to review at your convenience later.  YOU SHOULD EXPECT: Some feelings of bloating in the abdomen. Passage of more gas than usual.  Walking can help get rid of the air that was put into your GI tract during the procedure and reduce the bloating. If you had a lower endoscopy (such as a colonoscopy or flexible sigmoidoscopy) you may notice spotting of blood in your stool or on the toilet paper. If you underwent a bowel prep for your procedure, you may not have a normal bowel movement for a few days.  Please Note:  You might notice some irritation and congestion in your nose or some drainage.  This is from the oxygen used during your procedure.  There is no need for concern and it should clear up in a day or so.  SYMPTOMS TO REPORT IMMEDIATELY:   Following lower endoscopy (colonoscopy or flexible sigmoidoscopy):  Excessive amounts of blood in the stool  Significant tenderness or worsening of abdominal pains  Swelling of the abdomen that is new, acute  Fever of 100F or  higher    For urgent or emergent issues, a gastroenterologist can be reached at any hour by calling [phone].   DIET:  We do recommend a small meal at first, but then you may proceed to your regular diet.  Drink plenty of fluids but you should avoid alcoholic beverages for 24 hours.  ACTIVITY:  You should plan to take it easy for the rest of today and you should NOT DRIVE or use heavy machinery until tomorrow (because of the sedation medicines used during the test).    FOLLOW UP: Our staff will call the number listed on your records the next business day following your procedure to check on you and address any questions or concerns that you may have regarding the information given to you following your procedure. If we do not reach you, we will leave a message.  However, if you are feeling well and you are not experiencing any problems, there is no need to return our call.  We will assume that you have returned to your regular daily activities without incident.  If any biopsies were taken you will be contacted by phone or by letter within the next 1-3 weeks.  Please call us at [phone] if you have not heard about the biopsies in 3 weeks.    SIGNATURES/CONFIDENTIALITY: You and/or your care partner have signed paperwork which will be entered into your electronic medical record.  These signatures attest to the fact that that the information above  on your After Visit Summary has been reviewed and is understood.  Full responsibility of the confidentiality of this discharge information lies with you and/or your care-partner.

## 2018-05-26 NOTE — Progress Notes (Signed)
To recovery, report to RN, VSS. 

## 2018-05-27 ENCOUNTER — Telehealth: Payer: Self-pay

## 2018-05-27 NOTE — Telephone Encounter (Signed)
Second follow up call attempt, no answer, message left 

## 2018-05-27 NOTE — Telephone Encounter (Signed)
First post procedure follow up call, no answer 

## 2018-06-05 NOTE — Progress Notes (Signed)
[facility] [place], [place] [address] Phone: [phone] Subjective:    I Erin Boone am serving as a Education administrator for Dr. Hulan Saas.   I'm seeing this patient by the request  of:  Crecencio Mc, MD   CC: Right shoulder pain, right calf pain  LKT:[id number]  Erin Boone is a 47 y.o. female coming in with complaint of right shoulder and calf pain. Shoulder is her main concern. Calf muscle is tight. Late October when she felt a cramp in her calf. Leg was very sore for a few days after the cramp. Calf bruised and blood pooled in the bottom of the ankle for about a week. Standing on her toe makes it feel better. Had trouble walking. Wearing heels makes it feel better. Believes she may need to drink more water. Ankle swelled. TTP medial calf.   Onset- Chronic Location- Posterior shoulder, calf muscle  Duration- Daily pain Character- sharp, achy  Aggravating factors- reaching, ADL Reliving factors- Ibuprofen  Therapies tried-  Severity-     Past Medical History:  Diagnosis Date  . Genital warts   . Plantar fasciitis   . Vitamin D deficiency    Past Surgical History:  Procedure Laterality Date  . EYE SURGERY    . TONSILLECTOMY AND ADENOIDECTOMY Bilateral 1987   Social History   Socioeconomic History  . Marital status: Married    Spouse name: Not on file  . Number of children: Not on file  . Years of education: Not on file  . Highest education level: Not on file  Occupational History  . Not on file  Social Needs  . Financial resource strain: Not on file  . Food insecurity:    Worry: Not on file    Inability: Not on file  . Transportation needs:    Medical: Not on file    Non-medical: Not on file  Tobacco Use  . Smoking status: Never Smoker  . Smokeless tobacco: Never Used  Substance and Sexual Activity  . Alcohol use: Yes    Comment: varies  . Drug use: No  . Sexual activity: Never  Lifestyle  . Physical  activity:    Days per week: Not on file    Minutes per session: Not on file  . Stress: Not on file  Relationships  . Social connections:    Talks on phone: Not on file    Gets together: Not on file    Attends religious service: Not on file    Active member of club or organization: Not on file    Attends meetings of clubs or organizations: Not on file    Relationship status: Not on file  Other Topics Concern  . Not on file  Social History Narrative   She is divorced no children   She is a Geophysical data processor for a medical [employer]   2-3 alcoholic beverages daily never smoker 2 caffeinated beverages daily no drug use   No Known Allergies Family History  Problem Relation Age of Onset  . Hypertension Mother   . Dementia Mother   . Uterine cancer Mother   . Breast cancer Mother 48  . Arthritis Father   . Hypertension Father   . Other Father        thyroid goiter  . Heart disease Maternal Grandfather   . Diabetes Paternal Grandmother   . Diabetes Paternal Grandfather   . Diabetes Sister   . Diabetes Paternal Aunt   .  Diabetes Paternal Uncle   . Hypertension Brother   . Colon cancer Neg Hx   . Esophageal cancer Neg Hx   . Rectal cancer Neg Hx   . Stomach cancer Neg Hx     Current Outpatient Medications (Endocrine & Metabolic):  .  Levonorgestrel-Ethinyl Estradiol (AMETHIA,CAMRESE) 0.1-0.02 & 0.01 MG tablet, Take 1 tablet by mouth daily.     Current Outpatient Medications (Analgesics):  .  ibuprofen (ADVIL,MOTRIN) 200 MG tablet, Take 800 mg by mouth every 8 (eight) hours as needed for moderate pain.   Current Outpatient Medications (Other):  .  Biotin 1 MG CAPS, Take 1 capsule by mouth daily. .  hydrocortisone (ANUSOL-HC) 2.5 % rectal cream, Place 1 application rectally 2 (two) times daily as needed for hemorrhoids (treat for a few days after bleeding occurs). .  Vitamin D, Ergocalciferol, (DRISDOL) 1.25 MG (50000 UT) CAPS capsule, Take 1 capsule (50,000 Units total) by mouth  every 7 (seven) days.    Past medical history, social, surgical and family history all reviewed in electronic medical record.  No pertanent information unless stated regarding to the chief complaint.   Review of Systems:  No headache, visual changes, nausea, vomiting, diarrhea, constipation, dizziness, abdominal pain, skin rash, fevers, chills, night sweats, weight loss, swollen lymph nodes, body aches, joint swelling,  chest pain, shortness of breath, mood changes.  Positive muscle aches  Objective  Blood pressure 110/74, pulse 93, height 5\' 4"  (1.626 m), weight 167 lb (75.8 kg), SpO2 97 %.    General: No apparent distress alert and oriented x3 mood and affect normal, dressed appropriately.  HEENT: Pupils equal, extraocular movements intact  Respiratory: Patient's speak in full sentences and does not appear short of breath  Cardiovascular: No lower extremity edema, non tender, no erythema  Skin: Warm dry intact with no signs of infection or rash on extremities or on axial skeleton.  Abdomen: Soft nontender  Neuro: Cranial nerves II through XII are intact, neurovascularly intact in all extremities with 2+ DTRs and 2+ pulses.  Lymph: No lymphadenopathy of posterior or anterior cervical chain or axillae bilaterally.  Gait normal with good balance and coordination.  MSK:  Non tender with full range of motion and good stability and symmetric strength and tone of , elbows, wrist, hip, knee and ankles bilaterally.   Shoulder exam on inspection is unremarkable.  Patient does have positive impingement.  Positive crossover test, as well as some mild positive O'Brien's.  5 out of 5 strength of the rotator cuff.  Mild loss of range of motion with external rotation as well as internal rotation to L2 contralateral shoulder unremarkable  Limited musculoskeletal ultrasound was performed to interpreted by Lyndal Pulley  Limited ultrasound showed the patient in mild arthritic changes versus possible  calcific deposits within the acromioclavicular joint.  Mild swelling of the bicep tendon noted within the tendon sheath.  Rotator cuff appears to be intact mild calcific changes of the posterior labrum Impression: Acromioclavicular pain likely arthritic changes early  After verbal consent patient was prepped with alcohol swabs and with a 25-gauge 1 inch needle injected into the right acromioclavicular joint with a total of 0.5 cc of 0.5% Marcaine and 0.5 cc of Kenalog 40 mg/mL.  No blood loss.  Postinjection instructions given.  [id number]; 15 additional minutes spent for Therapeutic exercises as stated in above notes.  This included exercises focusing on stretching, strengthening, with significant focus on eccentric aspects.   Long term goals include an improvement in range  of motion, strength, endurance as well as avoiding reinjury. Patient's frequency would include in 1-2 times a day, 3-5 times a week for a duration of 6-12 weeks. Shoulder Exercises that included:  Basic scapular stabilization to include adduction and depression of scapula Scaption, focusing on proper movement and good control Internal and External rotation utilizing a theraband, with elbow tucked at side entire time Rows with theraband   Proper technique shown and discussed handout in great detail with ATC.  All questions were discussed and answered.     Impression and Recommendations:     This case required medical decision making of moderate complexity. The above documentation has been reviewed and is accurate and complete Lyndal Pulley, DO       Note: This dictation was prepared with Dragon dictation along with smaller phrase technology. Any transcriptional errors that result from this process are unintentional.

## 2018-06-06 ENCOUNTER — Encounter: Payer: Self-pay | Admitting: Family Medicine

## 2018-06-06 ENCOUNTER — Ambulatory Visit: Payer: Self-pay

## 2018-06-06 ENCOUNTER — Ambulatory Visit: Payer: 59 | Admitting: Family Medicine

## 2018-06-06 VITALS — BP 110/74 | HR 93 | Ht 64.0 in | Wt 167.0 lb

## 2018-06-06 DIAGNOSIS — G8929 Other chronic pain: Secondary | ICD-10-CM | POA: Diagnosis not present

## 2018-06-06 DIAGNOSIS — M25511 Pain in right shoulder: Secondary | ICD-10-CM | POA: Diagnosis not present

## 2018-06-06 DIAGNOSIS — M25519 Pain in unspecified shoulder: Secondary | ICD-10-CM

## 2018-06-06 DIAGNOSIS — S86111A Strain of other muscle(s) and tendon(s) of posterior muscle group at lower leg level, right leg, initial encounter: Secondary | ICD-10-CM

## 2018-06-06 HISTORY — DX: Strain of other muscle(s) and tendon(s) of posterior muscle group at lower leg level, right leg, initial encounter: S86.111A

## 2018-06-06 HISTORY — DX: Pain in unspecified shoulder: M25.519

## 2018-06-06 MED ORDER — VITAMIN D (ERGOCALCIFEROL) 1.25 MG (50000 UNIT) PO CAPS: 50000.0000 [IU] | ORAL_CAPSULE | ORAL | 0 refills | Status: DC

## 2018-06-06 NOTE — Assessment & Plan Note (Signed)
Patient given injection.  Tolerated the procedure well.  Discussed icing regimen and home exercise.  Discussed which activities to do which wants to avoid.  Follow-up again in 4 to 6 weeks

## 2018-06-06 NOTE — Patient Instructions (Signed)
Good to see you  Injected AC joint today  pennsaid pinkie amount topically 2 times daily as needed.  Ice 20 minutes 2 times daily. Usually after activity and before bed. Exercises 3 times a week.  For the calf compression sleeve with activity  Heel lift can help  See me again in 6 ish weeks

## 2018-06-06 NOTE — Assessment & Plan Note (Signed)
Home exercise, calf compression, heel lift, follow-up in 4 to 6 weeks

## 2018-07-04 ENCOUNTER — Ambulatory Visit: Payer: 59 | Admitting: Internal Medicine

## 2018-07-04 ENCOUNTER — Encounter: Payer: Self-pay | Admitting: Internal Medicine

## 2018-07-04 VITALS — BP 120/78 | HR 85 | Temp 98.1°F | Resp 14 | Ht 64.0 in | Wt 162.0 lb

## 2018-07-04 DIAGNOSIS — Z87898 Personal history of other specified conditions: Secondary | ICD-10-CM

## 2018-07-04 DIAGNOSIS — E782 Mixed hyperlipidemia: Secondary | ICD-10-CM

## 2018-07-04 DIAGNOSIS — R002 Palpitations: Secondary | ICD-10-CM | POA: Diagnosis not present

## 2018-07-04 DIAGNOSIS — Z0000 Encounter for general adult medical examination without abnormal findings: Secondary | ICD-10-CM

## 2018-07-04 MED ORDER — NYSTATIN 100000 UNIT/GM EX POWD: Freq: Two times a day (BID) | CUTANEOUS | 4 refills | Status: DC

## 2018-07-04 NOTE — Patient Instructions (Addendum)
After you finish megadose of D  resume 2000 iUs of Vit D3 daily  And consider adding hair skin and nails supplement    Palpitations Palpitations are feelings that your heartbeat is irregular or is faster than normal. It may feel like your heart is fluttering or skipping a beat. Palpitations are usually not a serious problem. They may be caused by many things, including smoking, caffeine, alcohol, stress, and certain medicines or drugs. Most causes of palpitations are not serious. However, some palpitations can be a sign of a serious problem. You may need further tests to rule out serious medical problems. Follow these instructions at home:     Pay attention to any changes in your condition. Take these actions to help manage your symptoms: Eating and drinking  Avoid foods and drinks that may cause palpitations. These may include: ? Caffeinated coffee, tea, soft drinks, diet pills, and energy drinks. ? Chocolate. ? Alcohol. Lifestyle  Take steps to reduce your stress and anxiety. Things that can help you relax include: ? Yoga. ? Mind-body activities, such as deep breathing, meditation, or using words and images to create positive thoughts (guided imagery). ? Physical activity, such as swimming, jogging, or walking. Tell your health care provider if your palpitations increase with activity. If you have chest pain or shortness of breath with activity, do not continue the activity until you are seen by your health care provider. ? Biofeedback. This is a method that helps you learn to use your mind to control things in your body, such as your heartbeat.  Do not use drugs, including cocaine or ecstasy. Do not use marijuana.  Get plenty of rest and sleep. Keep a regular bed time. General instructions  Take over-the-counter and prescription medicines only as told by your health care provider.  Do not use any products that contain nicotine or tobacco, such as cigarettes and e-cigarettes. If you  need help quitting, ask your health care provider.  Keep all follow-up visits as told by your health care provider. This is important. These may include visits for further testing if palpitations do not go away or get worse. Contact a health care provider if you:  Continue to have a fast or irregular heartbeat after 24 hours.  Notice that your palpitations occur more often. Get help right away if you:  Have chest pain or shortness of breath.  Have a severe headache.  Feel dizzy or you faint. Summary  Palpitations are feelings that your heartbeat is irregular or is faster than normal. It may feel like your heart is fluttering or skipping a beat.  Palpitations may be caused by many things, including smoking, caffeine, alcohol, stress, certain medicines, and drugs.  Although most causes of palpitations are not serious, some causes can be a sign of a serious medical problem.  Get help right away if you faint or have chest pain, shortness of breath, a severe headache, or dizziness. This information is not intended to replace advice given to you by your health care provider. Make sure you discuss any questions you have with your health care provider. Document Released: 06/01/2000 Document Revised: 07/17/2017 Document Reviewed: 07/17/2017 Elsevier Interactive Patient Education  2019 [employer].

## 2018-07-04 NOTE — Progress Notes (Signed)
Patient ID: Erin Boone, female    DOB: [birth year]  Age: 48 y.o. MRN: [id number]  The patient is here for annual preventive examination and management of other chronic and acute problems.  Had GYN /breast by [facility] within the past year   Taking vitamin D mega dose every Sunday  Till march 20, prescribed by Dr Charlann Boxer for muscle tear.   Colonoscopy in Dec for rectal bleeding  Hemorrhoids itchy rash between breasts    Gastrocnemius tear,  atraumatic On right,  Right shoulder pain from overuse injury (working out)  Still problematic .  [place] exam this morning    The risk factors are reflected in the social history.  The roster of all physicians providing medical care to patient - is listed in the Snapshot section of the chart.  Activities of daily living:  The patient is 100% independent in all ADLs: dressing, toileting, feeding as well as independent mobility  Home safety : The patient has smoke detectors in the home. They wear seatbelts.  There are no firearms at home. There is no violence in the home.   There is no risks for hepatitis, STDs or HIV. There is no   history of blood transfusion. They have no travel history to infectious disease endemic areas of the world.  The patient has seen their dentist in the last six month. They have seen their eye doctor in the last year. They admit to slight hearing difficulty with regard to whispered voices and some television programs.  They have deferred audiologic testing in the last year.  They do not  have excessive sun exposure. Discussed the need for sun protection: hats, long sleeves and use of sunscreen if there is significant sun exposure.   Diet: the importance of a healthy diet is discussed. They do have a healthy diet.  The benefits of regular aerobic exercise were discussed. She has had to stop working out due to shoulder and calf injuries  And has gained weight . Marland Kitchen   Depression screen: there are no signs or  vegative symptoms of depression- irritability, change in appetite, anhedonia, sadness/tearfullness.   The following portions of the patient's history were reviewed and updated as appropriate: allergies, current medications, past family history, past medical history,  past surgical history, past social history  and problem list.  Visual acuity was not assessed per patient preference since she has regular follow up with her ophthalmologist. Hearing and body mass index were assessed and reviewed.   During the course of the visit the patient was educated and counseled about appropriate screening and preventive services including : fall prevention , diabetes screening, nutrition counseling, colorectal cancer screening, and recommended immunizations.    CC: The primary encounter diagnosis was Palpitations. Diagnoses of Moderate mixed hyperlipidemia not requiring statin therapy, Routine general medical examination at a health care facility, and History of palpitations were also pertinent to this visit.  Weight gain: secondary to lpase in exercise due toright  gastroc muscle tear and left shoulder pain.  Diet reviewed  History of palpitations:  Rare occurrences of a fluttering feeling in chest.  Has not happened in several week.s  Does not occur with exercise.  No chest pain or presyncope .  History Erin Boone has a past medical history of Genital warts, Plantar fasciitis, and Vitamin D deficiency.   She has a past surgical history that includes Eye surgery and Tonsillectomy and adenoidectomy (Bilateral, 1987).   Her family history includes  Arthritis in her father; Breast cancer (age of onset: 9) in her mother; Dementia in her mother; Diabetes in her paternal aunt, paternal grandfather, paternal grandmother, paternal uncle, and sister; Heart disease in her maternal grandfather; Hypertension in her brother, father, and mother; Other in her father; Uterine cancer in her mother.She reports that she has never  smoked. She has never used smokeless tobacco. She reports current alcohol use. She reports that she does not use drugs.  Outpatient Medications Prior to Visit  Medication Sig Dispense Refill  . Levonorgestrel-Ethinyl Estradiol (AMETHIA,CAMRESE) 0.1-0.02 & 0.01 MG tablet Take 1 tablet by mouth daily.     . Vitamin D, Ergocalciferol, (DRISDOL) 1.25 MG (50000 UT) CAPS capsule Take 1 capsule (50,000 Units total) by mouth every 7 (seven) days. 12 capsule 0  . Biotin 1 MG CAPS Take 1 capsule by mouth daily.    . hydrocortisone (ANUSOL-HC) 2.5 % rectal cream Place 1 application rectally 2 (two) times daily as needed for hemorrhoids (treat for a few days after bleeding occurs). (Patient not taking: Reported on 07/04/2018) 30 g 1  . ibuprofen (ADVIL,MOTRIN) 200 MG tablet Take 800 mg by mouth every 8 (eight) hours as needed for moderate pain.     No facility-administered medications prior to visit.     Review of Systems  Patient denies headache, fevers, malaise, unintentional weight loss, skin rash, eye pain, sinus congestion and sinus pain, sore throat, dysphagia,  hemoptysis , cough, dyspnea, wheezing, chest pain, palpitations, orthopnea, edema, abdominal pain, nausea, melena, diarrhea, constipation, flank pain, dysuria, hematuria, urinary  Frequency, nocturia, numbness, tingling, seizures,  Focal weakness, Loss of consciousness,  Tremor, insomnia, depression, anxiety, and suicidal ideation.     Objective:  BP 120/78 (BP Location: Left Arm, Patient Position: Sitting, Cuff Size: Normal)   Pulse 85   Temp 98.1 F (36.7 C) (Oral)   Resp 14   Ht '5\' 4"'$  (1.626 m)   Wt 162 lb (73.5 kg)   SpO2 96%   BMI 27.81 kg/m   Physical Exam   General appearance: alert, cooperative and appears stated age Ears: normal TM's and external ear canals both ears Throat: lips, mucosa, and tongue normal; teeth and gums normal Neck: no adenopathy, no carotid bruit, supple, symmetrical, trachea midline and thyroid not  enlarged, symmetric, no tenderness/mass/nodules Back: symmetric, no curvature. ROM normal. No CVA tenderness. Lungs: clear to auscultation bilaterally Heart: regular rate and rhythm, S1, S2 normal, no murmur, click, rub or gallop Abdomen: soft, non-tender; bowel sounds normal; no masses,  no organomegaly Pulses: 2+ and symmetric Skin: Skin color, texture, turgor normal. No rashes or lesions Lymph nodes: Cervical, supraclavicular, and axillary nodes normal.   Assessment & Plan:   Problem List Items Addressed This Visit    History of palpitations    Her cardiovascular exam is normal.  Lytes are normal as is thyroid not occurring even on a monthly basis  .  No further workup unless they become more frequent.   Lab Results  Component Value Date   TSH 1.50 07/04/2018   Lab Results  Component Value Date   NA 138 07/04/2018   K 4.6 07/04/2018   CL 101 07/04/2018   CO2 23 07/04/2018   Lab Results  Component Value Date   WBC 6.6 07/04/2018   HGB 13.3 07/04/2018   HCT 38.3 07/04/2018   MCV 94.1 07/04/2018   PLT 277 07/04/2018         Routine general medical examination at a health care facility  Annual wellness  exam was done as well as a comprehensive physical exam  .  During the course of the visit the patient was educated and counseled about appropriate screening and preventive services and screenings were discussed with regard to timing of initiation of cervical and breast cancer .  She is not sexually active and has had the Gardasil vaccine series as an adolescent.  She had screenign for hyperlipidemia last year and does not require annual lipid screening.   nutrition counseling, skin cancer screening has been done, along with review of the age appropriate recommended immunizations.  Printed recommendations for health maintenance screenings was given.        Other Visit Diagnoses    Palpitations    -  Primary   Relevant Orders   Comp Met (CMET) (Completed)   Magnesium  (Completed)   TSH (Completed)   CBC w/Diff (Completed)   Moderate mixed hyperlipidemia not requiring statin therapy       Relevant Orders   Direct LDL (Completed)   Lipid Profile (Completed)      I have discontinued Don D. Agar's ibuprofen and hydrocortisone. I am also having her start on nystatin. Additionally, I am having her maintain her Levonorgestrel-Ethinyl Estradiol, Biotin, and Vitamin D (Ergocalciferol).  Meds ordered this encounter  Medications  . nystatin (MYCOSTATIN/NYSTOP) powder    Sig: Apply topically 2 (two) times daily.    Dispense:  15 g    Refill:  4   A total of 40 minutes was spent with patient more than half of which was spent in counseling patient on the above mentioned issues , reviewing and explaining recent labs and imaging studies done, and coordination of care.   Medications Discontinued During This Encounter  Medication Reason  . hydrocortisone (ANUSOL-HC) 2.5 % rectal cream Patient has not taken in last 30 days  . ibuprofen (ADVIL,MOTRIN) 200 MG tablet Patient Preference    Follow-up: No follow-ups on file.   Crecencio Mc, MD

## 2018-07-05 LAB — CBC WITH DIFFERENTIAL/PLATELET
Absolute Monocytes: 508 cells/uL (ref 200–950)
Basophils Absolute: 40 cells/uL (ref 0–200)
Basophils Relative: 0.6 %
Eosinophils Absolute: 132 cells/uL (ref 15–500)
Eosinophils Relative: 2 %
HCT: 38.3 % (ref 35.0–45.0)
Hemoglobin: 13.3 g/dL (ref 11.7–15.5)
Lymphs Abs: 2317 cells/uL (ref 850–3900)
MCH: 32.7 pg (ref 27.0–33.0)
MCHC: 34.7 g/dL (ref 32.0–36.0)
MCV: 94.1 fL (ref 80.0–100.0)
MPV: 10.8 fL (ref 7.5–12.5)
Monocytes Relative: 7.7 %
NEUTROS ABS: 3604 {cells}/uL (ref 1500–7800)
Neutrophils Relative %: 54.6 %
Platelets: 277 10*3/uL (ref 140–400)
RBC: 4.07 10*6/uL (ref 3.80–5.10)
RDW: 11.8 % (ref 11.0–15.0)
Total Lymphocyte: 35.1 %
WBC: 6.6 10*3/uL (ref 3.8–10.8)

## 2018-07-05 LAB — COMPREHENSIVE METABOLIC PANEL
AG Ratio: 1.7 (calc) (ref 1.0–2.5)
ALT: 21 U/L (ref 6–29)
AST: 33 U/L (ref 10–35)
Albumin: 4.3 g/dL (ref 3.6–5.1)
Alkaline phosphatase (APISO): 40 U/L (ref 33–115)
BUN: 13 mg/dL (ref 7–25)
CO2: 23 mmol/L (ref 20–32)
Calcium: 9.2 mg/dL (ref 8.6–10.2)
Chloride: 101 mmol/L (ref 98–110)
Creat: 0.87 mg/dL (ref 0.50–1.10)
Globulin: 2.5 g/dL (calc) (ref 1.9–3.7)
Glucose, Bld: 78 mg/dL (ref 65–99)
Potassium: 4.6 mmol/L (ref 3.5–5.3)
Sodium: 138 mmol/L (ref 135–146)
Total Bilirubin: 0.3 mg/dL (ref 0.2–1.2)
Total Protein: 6.8 g/dL (ref 6.1–8.1)

## 2018-07-05 LAB — LIPID PANEL
CHOL/HDL RATIO: 3.5 (calc) (ref <5.0)
CHOLESTEROL: 181 mg/dL (ref <200)
HDL: 52 mg/dL (ref >50)
LDL Cholesterol (Calc): 113 mg/dL (calc) — ABNORMAL HIGH
Non-HDL Cholesterol (Calc): 129 mg/dL (calc) (ref <130)
Triglycerides: 73 mg/dL (ref <150)

## 2018-07-05 LAB — LDL CHOLESTEROL, DIRECT: Direct LDL: 115 mg/dL — ABNORMAL HIGH (ref <100)

## 2018-07-05 LAB — TSH: TSH: 1.5 mIU/L

## 2018-07-05 LAB — MAGNESIUM: MAGNESIUM: 2 mg/dL (ref 1.5–2.5)

## 2018-07-06 ENCOUNTER — Encounter: Payer: Self-pay | Admitting: Internal Medicine

## 2018-07-06 DIAGNOSIS — Z87898 Personal history of other specified conditions: Secondary | ICD-10-CM | POA: Insufficient documentation

## 2018-07-06 NOTE — Assessment & Plan Note (Signed)
Annual wellness  exam was done as well as a comprehensive physical exam  .  During the course of the visit the patient was educated and counseled about appropriate screening and preventive services and screenings were discussed with regard to timing of initiation of cervical and breast cancer .  She is not sexually active and has had the Gardasil vaccine series as an adolescent.  She had screenign for hyperlipidemia last year and does not require annual lipid screening.   nutrition counseling, skin cancer screening has been done, along with review of the age appropriate recommended immunizations.  Printed recommendations for health maintenance screenings was given.  

## 2018-07-06 NOTE — Assessment & Plan Note (Signed)
Her cardiovascular exam is normal.  Lytes are normal as is thyroid not occurring even on a monthly basis  .  No further workup unless they become more frequent.   Lab Results  Component Value Date   TSH 1.50 07/04/2018   Lab Results  Component Value Date   NA 138 07/04/2018   K 4.6 07/04/2018   CL 101 07/04/2018   CO2 23 07/04/2018   Lab Results  Component Value Date   WBC 6.6 07/04/2018   HGB 13.3 07/04/2018   HCT 38.3 07/04/2018   MCV 94.1 07/04/2018   PLT 277 07/04/2018

## 2018-09-04 ENCOUNTER — Telehealth: Payer: Self-pay | Admitting: Emergency Medicine

## 2018-09-04 ENCOUNTER — Telehealth: Payer: 59 | Admitting: Physician Assistant

## 2018-09-04 DIAGNOSIS — Z20828 Contact with and (suspected) exposure to other viral communicable diseases: Secondary | ICD-10-CM | POA: Diagnosis not present

## 2018-09-04 MED ORDER — BENZONATATE 100 MG PO CAPS: 100.0000 mg | ORAL_CAPSULE | Freq: Three times a day (TID) | ORAL | 0 refills | Status: DC | PRN

## 2018-09-04 NOTE — Progress Notes (Addendum)
Based on what you shared with me, I feel that you are considered high risk for Corona virus virus because of a known exposure, fever, shortness of breath and cough.  You should proceed to our testing site at [address] [place] [address].   I have placed an order for you to have the cornoavirus (COVID19) test done.  - You will be tested for (COVID-19) and discharged home on quarantine except to seek medical care if symptoms worsen, and asked to  - Stay home and avoid contact with others until you get your results (4-5 days)  - Avoid travel on public transportation if possible (such as bus, train, or airplane)  Continue to monitor at home and seek medical attention if your symptoms worsen.  If you are having a medical emergency, call 911.  You can use medications such as A prescription cough medication called Tessalon Perles 100 mg. You may take 1-2 capsules every 8 hours as needed for cough  Please review the forms below as these are required. PRINT, sign and complete and bring with you if possible to the testing site.     Person Under Monitoring Name: Erin Boone  Location: [facility] [place] [address]   CORONAVIRUS DISEASE 2019 (COVID-19) Guidance for Persons Under Investigation You are being tested for the virus that causes coronavirus disease 2019 (COVID-19). Public health actions are necessary to ensure protection of your health and the health of others, and to prevent further spread of infection. COVID-19 is caused by a virus that can cause symptoms, such as fever, cough, and shortness of breath. The primary transmission from person to person is by coughing or sneezing. On July 17, 2018, the [place] announced a [employer] Emergency of International Concern and on July 18, 2018 the U.S. Department of Health and Human Services declared a public health emergency. If the virus that causesCOVID-19 spreads in the community, it could have  severe public health consequences.  As a person under investigation for COVID-19, the [employer] advises you to adhere to the following guidance until your test results are reported to you. If your test result is positive, you will receive additional information from your provider and your local health department at that time.   Remain at home until you are cleared by your health provider or public health authorities.   Keep a log of visitors to your home using the form provided. Any visitors to your home must be aware of your isolation status.  If you plan to move to a new address or leave the county, notify the local health department in your county.  Call a doctor or seek care if you have an urgent medical need. Before seeking medical care, call ahead and get instructions from the provider before arriving at the medical office, clinic or hospital. Notify them that you are being tested for the virus that causes COVID-19 so arrangements can be made, as necessary, to prevent transmission to others in the healthcare setting. Next, notify the local health department in your county.  If a medical emergency arises and you need to call 911, inform the first responders that you are being tested for the virus that causes COVID-19. Next, notify the local health department in your county.  Adhere to all guidance set forth by the [place] for [facility] of patients that is based on guidance from  the Center for Disease Control and Prevention with suspected or confirmed COVID-19. It is provided with this guidance for Persons Under Investigation.  Your health and the health of our community are our top priorities. Public Health officials remain available to provide assistance and counseling to you about COVID-19 and compliance with this guidance.  Provider: ___________________William C  Martin_________________________________________ Date: ___3___/___19__/___20______  By signing below, you acknowledge that you have read and agree to comply with this Guidance for Persons Under Investigation. ______________________________________________________________ Date: ______/_____/_________  WHO DO I CALL? You can find a list of local health departments here: [email or web address] Health Department: ____________________________________________________________________ Contact Name: ________________________________________________________________________ Telephone: ___________________________________________________________________________  Marice Potter, [place], Communicable Disease Branch COVID-19 Guidance for Persons Under Investigation August 23, 2018   Person Under Monitoring Name: Erin Boone  Location: [place]. [place] [address]   Record here the list of visitors to your home since you became ill with respiratory symptoms that led you to consult a health provider:  Visitor Name Date Time In Time Out Did this person come within 6 feet of you? Indicate Y or N Relationship to Person Under Monitoring Phone number Comments   ___/____/____ __:__ AM/PM __:__ AM/PM       ___/____/____ __:__ AM/PM __:__ AM/PM       ___/____/____ __:__ AM/PM __:__ AM/PM       ___/____/____ __:__ AM/PM __:__ AM/PM       ___/____/____ __:__ AM/PM __:__ AM/PM       ___/____/____ __:__ AM/PM __:__ AM/PM       ___/____/____ __:__ AM/PM __:__ AM/PM       ___/____/____ __:__ AM/PM __:__ AM/PM       ___/____/____ __:__ AM/PM __:__ AM/PM       ___/____/____ __:__ AM/PM __:__ AM/PM       ___/____/____ __:__ AM/PM __:__ AM/PM       ___/____/____ __:__ AM/PM __:__ AM/PM       ___/____/____ __:__ AM/PM __:__ AM/PM       ___/____/____ __:__ AM/PM __:__ AM/PM       Marice Potter, [place], Communicable Disease Branch

## 2018-09-04 NOTE — Progress Notes (Signed)
I have spent 5 minutes in review of e-visit questionnaire, review and updating patient chart, medical decision making and response to patient.   Riyaan Heroux Cody Nyasiah Moffet, PA-C    

## 2018-09-06 ENCOUNTER — Other Ambulatory Visit: Payer: Self-pay | Admitting: Family Medicine

## 2018-09-10 NOTE — Progress Notes (Signed)
Erin Boone,  If it is positive you should definitely be getting a call about the lab results. The [facility] system will release the results directly to patients so you should be able to access the results via mychart. I am not sure if you will get a text but I know you will have access to the results at the same time they result, at the same time the provider has access to them. There have been lab delays due to the high volume of test but most are back by the time frame of 4-6 days. Apologies for the delay in information. I hope this information is helpful.   At least 5-10 min. Were spent on this patient encounter through record and history review, suspected diagnosis research and response to patient.

## 2018-09-14 ENCOUNTER — Encounter: Payer: Self-pay | Admitting: Physician Assistant

## 2018-09-14 LAB — NOVEL CORONAVIRUS, NAA: SARS-COV-2, NAA: NOT DETECTED

## 2019-07-08 ENCOUNTER — Ambulatory Visit (INDEPENDENT_AMBULATORY_CARE_PROVIDER_SITE_OTHER): Payer: 59 | Admitting: Mental Health

## 2019-07-08 ENCOUNTER — Other Ambulatory Visit: Payer: Self-pay

## 2019-07-08 DIAGNOSIS — F4323 Adjustment disorder with mixed anxiety and depressed mood: Secondary | ICD-10-CM

## 2019-07-08 NOTE — Progress Notes (Signed)
Psychotherapy note  Name: ERMON SARVIS Date: 07/08/2019 MRN: [id number] DOB: Mar 23, 1971 PCP: Crecencio Mc, MD  Time spent: 57 minutes  Treatment : Individual therapy  Mental Status Exam:   Appearance:   Casual     Behavior:  Appropriate  Motor:  Normal  Speech/Language:   Normal Rate  Affect:   Constricted  Mood:  depressed  Thought process:  Coherent and Relevant  Thought content:    WDL and Logical  Perceptual disturbances:    Normal  Orientation:  Full (Time, Place, and Person)  Attention:  Good  Concentration:  good  Memory:  Immediate  Fund of knowledge:   Good  Insight:    Good  Judgment:   Good  Impulse Control:  good    Reported Symptoms:    Risk Assessment: Danger to Self:  Noreviewed Self-injurious Behavior: No Danger to Others: No Duty to Warn:no Physical Aggression / Violence:No  Access to Firearms a concern: No  Gang Involvement:No  Patient / guardian was educated about steps to take if suicide or homicide risk level increases between visits: no While future psychiatric events cannot be accurately predicted, the patient does not currently require acute inpatient psychiatric care and does not currently meet [facility] involuntary commitment criteria.  Medications: Current Outpatient Medications  Medication Sig Dispense Refill  . benzonatate (TESSALON) 100 MG capsule Take 1 capsule (100 mg total) by mouth 3 (three) times daily as needed for cough. 30 capsule 0  . Biotin 1 MG CAPS Take 1 capsule by mouth daily.    . Levonorgestrel-Ethinyl Estradiol (AMETHIA,CAMRESE) 0.1-0.02 & 0.01 MG tablet Take 1 tablet by mouth daily.     Marland Kitchen nystatin (MYCOSTATIN/NYSTOP) powder Apply topically 2 (two) times daily. 15 g 4  . Vitamin D, Ergocalciferol, (DRISDOL) 1.25 MG (50000 UT) CAPS capsule TAKE 1 CAPSULE (50,000 UNITS TOTAL) BY MOUTH EVERY 7 (SEVEN) DAYS. 12 capsule 0   No current facility-administered medications for this visit.   No Known  Allergies   Subjective:  Patient arrived on time for today's session.  Continue to assess as it has been about 1 year since her last session.  Patient stated that she wants to work on her anger issues.  She stated these are centered around the relationship with her father.  She stated that she feels "cold" noticing some behavioral changes, increased irritability and some of her relationships.  She stated her boyfriend continues to drink at least 12 beers per night and this has been ongoing for the past few years.  Her father was recently released from prison and he is currently living with them.  This is been a significant source of distress as patient went on to describe her relational history with her father.  She stated that he was in prison for about 20 years due to white-collar crime such as money wandering.  She shared how the family would often not disclose where their father was often stating he was on business over the years.  She stated she is tired of lying for him, going on to share other concerning behaviors he has had toward her adult niece.  Day-to-day she stated that he often will give commands or make statements about his needs but does not ask.  Mother passed away in 09/16/16.  She stated that at this point, she is assisting him in finding another possible residence as she feels her stress will decrease if he is not living with her.  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  We discussed some ways to continue to set some boundaries in this relationship as well as making sure that she allows herself a few months before interacting with him to perform a mental check so that she is less emotionally reactive.  We plan to further discuss her marital relationship which remains stressful as well in her coming session next week.  Interventions: assessment, CBT, solution focused approaches  Diagnoses:    ICD-10-CM   1. Adjustment disorder with mixed  anxiety and depressed mood  F43.23      Plan: Patient is to use CBT, mindfulness and coping skills to help manage decrease symptoms.  Patient to continue to set boundaries with her father while also working to be more mindful prior to interactions with him to more effectively manage her anger.   Long-term goal:   Reduce overall level, frequency, and intensity of the feelings of depression, anxiety up to 80% of the time per patient reports.  Reduce anger outbursts and coping evidenced by feeling less anger and "coldness" in her relationships w/ some family members.  Short-term goal:  To identify and process feelings related to the disappointment of past painful events that increase worthless feelings. Verbally express understanding of the relationship between depressed mood and repression of feelings - such as anger, hurt, and sadness. Improve communication skills and boundary setting w/ her father   Assessment of progress:  progressing    Anson [place], [facility]

## 2019-07-15 ENCOUNTER — Other Ambulatory Visit: Payer: Self-pay

## 2019-07-15 ENCOUNTER — Ambulatory Visit (INDEPENDENT_AMBULATORY_CARE_PROVIDER_SITE_OTHER): Payer: 59 | Admitting: Mental Health

## 2019-07-15 DIAGNOSIS — F4323 Adjustment disorder with mixed anxiety and depressed mood: Secondary | ICD-10-CM | POA: Diagnosis not present

## 2019-07-15 NOTE — Progress Notes (Signed)
Psychotherapy note  Name: Erin Boone Date: 07/17/2019 MRN: [id number] DOB: 08/25/1970 PCP: Crecencio Mc, MD  Time spent: 57 minutes  Treatment : Individual therapy  Mental Status Exam:   Appearance:   Casual     Behavior:  Appropriate  Motor:  Normal  Speech/Language:   Normal Rate  Affect:   Constricted  Mood:  depressed  Thought process:  Coherent and Relevant  Thought content:    WDL and Logical  Perceptual disturbances:    Normal  Orientation:  Full (Time, Place, and Person)  Attention:  Good  Concentration:  good  Memory:  Immediate  Fund of knowledge:   Good  Insight:    Good  Judgment:   Good  Impulse Control:  good    Reported Symptoms: Irritability, depressed mood, anxiety  Risk Assessment: Danger to Self:  Noreviewed Self-injurious Behavior: No Danger to Others: No Duty to Warn:no Physical Aggression / Violence:No  Access to Firearms a concern: No  Gang Involvement:No  Patient / guardian was educated about steps to take if suicide or homicide risk level increases between visits: no While future psychiatric events cannot be accurately predicted, the patient does not currently require acute inpatient psychiatric care and does not currently meet [facility] involuntary commitment criteria.  Medications: Current Outpatient Medications  Medication Sig Dispense Refill  . benzonatate (TESSALON) 100 MG capsule Take 1 capsule (100 mg total) by mouth 3 (three) times daily as needed for cough. 30 capsule 0  . Biotin 1 MG CAPS Take 1 capsule by mouth daily.    . Levonorgestrel-Ethinyl Estradiol (AMETHIA,CAMRESE) 0.1-0.02 & 0.01 MG tablet Take 1 tablet by mouth daily.     Marland Kitchen nystatin (MYCOSTATIN/NYSTOP) powder Apply topically 2 (two) times daily. 15 g 4  . Vitamin D, Ergocalciferol, (DRISDOL) 1.25 MG (50000 UT) CAPS capsule TAKE 1 CAPSULE (50,000 UNITS TOTAL) BY MOUTH EVERY 7 (SEVEN) DAYS. 12 capsule 0   No current facility-administered medications for this  visit.   No Known Allergies   Subjective:  Patient arrived on time for today's session.  Shared progress since her last session where she follow through on finding possible housing for her father and came up with several leads and shared how she feels a sense of accomplishment by taking the steps and following through.  She went on to discuss the relationship with her husband, his ongoing drinking and how she is attempted to set boundaries giving various details.  She shared how her previous husband of 15 years also had a drinking problem.  She shared how her current husband also has what appears to be self-esteem issues as he often asks her questions where he compares himself to others.  Some ways she was able to identify today in setting boundaries in the relationship particularly around his alcohol use.  Provided some resources per her request regarding potential treatment for her husband due to his sustained alcohol use.  Some ways to continue to cope with these daily stressors discussed was identified with patient.  She plans to follow through with some specific communication skills to empower her husband to take steps she feels would be helpful to him.  Taking the steps will allow patient to reduce some of her stress daily.  Interventions: assessment, CBT, solution focused approaches  Diagnoses:    ICD-10-CM   1. Adjustment disorder with mixed anxiety and depressed mood  F43.23      Plan: Patient is to use CBT, mindfulness and coping skills to help manage  decrease symptoms.  Patient to continue to set boundaries with her father while also working to be more mindful prior to interactions with him to more effectively manage her anger.   Long-term goal:   Reduce overall level, frequency, and intensity of the feelings of depression, anxiety up to 80% of the time per patient reports.  Reduce anger outbursts and coping evidenced by feeling less anger and "coldness" in her relationships w/ some  family members.  Short-term goal:  To identify and process feelings related to the disappointment of past painful events that increase worthless feelings. Verbally express understanding of the relationship between depressed mood and repression of feelings - such as anger, hurt, and sadness. Improve communication skills and boundary setting w/ her father   Assessment of progress:  progressing    Anson [place], [facility]

## 2019-07-31 ENCOUNTER — Ambulatory Visit (INDEPENDENT_AMBULATORY_CARE_PROVIDER_SITE_OTHER): Payer: 59 | Admitting: Mental Health

## 2019-07-31 ENCOUNTER — Other Ambulatory Visit: Payer: Self-pay

## 2019-07-31 DIAGNOSIS — F4323 Adjustment disorder with mixed anxiety and depressed mood: Secondary | ICD-10-CM | POA: Diagnosis not present

## 2019-07-31 NOTE — Progress Notes (Signed)
Psychotherapy note  Name: Erin Boone Date: 08/04/2019 MRN: [id number] DOB: [birth year] PCP: Crecencio Mc, MD  Time spent: 51 minutes  Treatment : Individual therapy  Mental Status Exam:   Appearance:   Casual     Behavior:  Appropriate  Motor:  Normal  Speech/Language:   Normal Rate  Affect:   Constricted  Mood:  depressed  Thought process:  Coherent and Relevant  Thought content:    WDL and Logical  Perceptual disturbances:    Normal  Orientation:  Full (Time, Place, and Person)  Attention:  Good  Concentration:  good  Memory:  Immediate  Fund of knowledge:   Good  Insight:    Good  Judgment:   Good  Impulse Control:  good    Reported Symptoms: Irritability, depressed mood, anxiety  Risk Assessment: Danger to Self:  Noreviewed Self-injurious Behavior: No Danger to Others: No Duty to Warn:no Physical Aggression / Violence:No  Access to Firearms a concern: No  Gang Involvement:No  Patient / guardian was educated about steps to take if suicide or homicide risk level increases between visits: no While future psychiatric events cannot be accurately predicted, the patient does not currently require acute inpatient psychiatric care and does not currently meet [facility] involuntary commitment criteria.  Medications: Current Outpatient Medications  Medication Sig Dispense Refill  . benzonatate (TESSALON) 100 MG capsule Take 1 capsule (100 mg total) by mouth 3 (three) times daily as needed for cough. 30 capsule 0  . Biotin 1 MG CAPS Take 1 capsule by mouth daily.    . Levonorgestrel-Ethinyl Estradiol (AMETHIA,CAMRESE) 0.1-0.02 & 0.01 MG tablet Take 1 tablet by mouth daily.     Marland Kitchen nystatin (MYCOSTATIN/NYSTOP) powder Apply topically 2 (two) times daily. 15 g 4  . Vitamin D, Ergocalciferol, (DRISDOL) 1.25 MG (50000 UT) CAPS capsule TAKE 1 CAPSULE (50,000 UNITS TOTAL) BY MOUTH EVERY 7 (SEVEN) DAYS. 12 capsule 0   No current facility-administered medications for this  visit.   No Known Allergies   Subjective:  Patient arrived on time for today's session.  She shared progress, how she and her husband are having increased relational problems.  She went on to state how her father continues to get situated in his new residence and she feels that now that he is out of their house that they are now having increased arguments which she somewhat predicted may occur.  She stated that he continues to drink excessively most nights, that he contributes minimally financially to the household bills and this is been ongoing for some time.  She identified through discovery needing changes, how she has encouraged him to get help for his alcohol use but that he denies having any problems.  She explored and identified ways she wants to communicate in this relationship but identifying her needs at this point.  She was also able to identify her history of relationships in her patterns of behaviors toward "rescuing" others and how this correlates to her father's incarceration and her needing to take on substantial responsibilities early in her life and for many years.  Interventions: assessment, CBT, solution focused approaches  Diagnoses:    ICD-10-CM   1. Adjustment disorder with mixed anxiety and depressed mood  F43.23      Plan: Patient is to use CBT, mindfulness and coping skills to help manage decrease symptoms.  Patient to utilize communication skills discussed in session and her relationships, specifically with her husband and sharing her needs.  Long-term goal:   Reduce  overall level, frequency, and intensity of the feelings of depression, anxiety up to 80% of the time per patient reports.  Reduce anger outbursts and coping evidenced by feeling less anger and "coldness" in her relationships w/ some family members.  Short-term goal:  To identify and process feelings related to the disappointment of past painful events that increase worthless feelings. Verbally express  understanding of the relationship between depressed mood and repression of feelings - such as anger, hurt, and sadness. Improve communication skills and boundary setting w/ her father   Assessment of progress:  progressing    Anson [place], [facility]

## 2019-08-14 ENCOUNTER — Ambulatory Visit (INDEPENDENT_AMBULATORY_CARE_PROVIDER_SITE_OTHER): Payer: 59 | Admitting: Mental Health

## 2019-08-14 ENCOUNTER — Other Ambulatory Visit: Payer: Self-pay

## 2019-08-14 DIAGNOSIS — F4323 Adjustment disorder with mixed anxiety and depressed mood: Secondary | ICD-10-CM

## 2019-08-14 NOTE — Progress Notes (Signed)
Psychotherapy note  Name: Erin Boone Date: 08/17/2019 MRN: [id number] DOB: 19-Apr-1971 PCP: Crecencio Mc, MD  Time spent: 51 minutes  Treatment : Individual therapy  Mental Status Exam:   Appearance:   Casual     Behavior:  Appropriate  Motor:  Normal  Speech/Language:   Normal Rate  Affect:   Constricted  Mood:  depressed  Thought process:  Coherent and Relevant  Thought content:    WDL and Logical  Perceptual disturbances:    Normal  Orientation:  Full (Time, Place, and Person)  Attention:  Good  Concentration:  good  Memory:  Immediate  Fund of knowledge:   Good  Insight:    Good  Judgment:   Good  Impulse Control:  good    Reported Symptoms: Irritability, depressed mood, anxiety  Risk Assessment: Danger to Self:  Noreviewed Self-injurious Behavior: No Danger to Others: No Duty to Warn:no Physical Aggression / Violence:No  Access to Firearms a concern: No  Gang Involvement:No  Patient / guardian was educated about steps to take if suicide or homicide risk level increases between visits: no While future psychiatric events cannot be accurately predicted, the patient does not currently require acute inpatient psychiatric care and does not currently meet [facility] involuntary commitment criteria.  Subjective:   Patient arrived on time for today's session.  She discussed recent events where she is considering confronting her father about some of his behaviors over the past few months.  She shared how she spoke with her sisters as relates to his significant drainage of her adult neice.  Most of the session was centered around processing thoughts, feelings related to the relationship she has with her father.  Through discovery, she identified needing to further talk with her sisters as she feels them as collaboratively come for him together.  She continues to have some marital strain and plans to discuss further next session some recent changes.  Interventions:  assessment, CBT, solution focused approaches  Diagnoses:    ICD-10-CM   1. Adjustment disorder with mixed anxiety and depressed mood  F43.23      Plan: Patient is to use CBT, mindfulness and coping skills to help manage decrease symptoms.  Patient to utilize communication skills discussed in session and her relationships, specifically with her husband and sharing her needs.  Long-term goal:   Reduce overall level, frequency, and intensity of the feelings of depression, anxiety up to 80% of the time per patient reports.  Reduce anger outbursts and coping evidenced by feeling less anger and "coldness" in her relationships w/ some family members.  Short-term goal:  To identify and process feelings related to the disappointment of past painful events that increase worthless feelings. Verbally express understanding of the relationship between depressed mood and repression of feelings - such as anger, hurt, and sadness. Improve communication skills and boundary setting w/ her father   Assessment of progress:  progressing    Anson [place], [facility]

## 2019-08-25 ENCOUNTER — Other Ambulatory Visit: Payer: Self-pay

## 2019-08-25 ENCOUNTER — Ambulatory Visit (INDEPENDENT_AMBULATORY_CARE_PROVIDER_SITE_OTHER): Payer: 59 | Admitting: Mental Health

## 2019-08-25 DIAGNOSIS — F4323 Adjustment disorder with mixed anxiety and depressed mood: Secondary | ICD-10-CM

## 2019-08-25 NOTE — Progress Notes (Signed)
Psychotherapy note  Name: Erin Boone Date: 08/25/2019 MRN: [id number] DOB: 08-07-1970 PCP: Crecencio Mc, MD  Time spent: 52 minutes  Treatment : Individual therapy  Mental Status Exam:   Appearance:   Casual     Behavior:  Appropriate  Motor:  Normal  Speech/Language:   Normal Rate  Affect:   Constricted  Mood:  depressed  Thought process:  Coherent and Relevant  Thought content:    WDL and Logical  Perceptual disturbances:    Normal  Orientation:  Full (Time, Place, and Person)  Attention:  Good  Concentration:  good  Memory:  Immediate  Fund of knowledge:   Good  Insight:    Good  Judgment:   Good  Impulse Control:  good    Reported Symptoms: Irritability, depressed mood, anxiety  Risk Assessment: Danger to Self:  Noreviewed Self-injurious Behavior: No Danger to Others: No Duty to Warn:no Physical Aggression / Violence:No  Access to Firearms a concern: No  Gang Involvement:No  Patient / guardian was educated about steps to take if suicide or homicide risk level increases between visits: no While future psychiatric events cannot be accurately predicted, the patient does not currently require acute inpatient psychiatric care and does not currently meet [facility] involuntary commitment criteria.  Subjective:    Patient presents on time for today's session in some distress.  Discussed progress.  She stated that she continues to have stress related to the relationship with her father, going on to provide details.  She stated that he recently spoke with his granddaughter and how patient felt this to be inappropriate based on the discussion shared.  Ways to continue to set limits with him while getting the support needed from her sisters was identified as a need in which she plans to follow through with in the coming weeks.  She identified needing to talk to her sisters as well as her niece and we assisted her in identifying some ways to clearly communicate these needs.   She went on to share the continued stress in her relationship with her boyfriend.  She stated that he continues to drink excessively and shared how arguments typically can ensue sharing some recent examples.  Through discovery she identified a need to regain some independence in her life as she struggled to articulate what the relationship is providing her at this point.  She went on to identify several unmet needs as well as recognizing this pattern as well in past relationships.  Interventions: assessment, CBT, solution focused approaches  Diagnoses:    ICD-10-CM   1. Adjustment disorder with mixed anxiety and depressed mood  F43.23      Plan: Patient is to use CBT, mindfulness and coping skills to help manage decrease symptoms.  Patient to utilize communication skills discussed in session and her relationships, specifically with her husband and sharing her needs.  Long-term goal:   Reduce overall level, frequency, and intensity of the feelings of depression, anxiety up to 80% of the time per patient reports.  Reduce anger outbursts and coping evidenced by feeling less anger and "coldness" in her relationships w/ some family members.  Short-term goal:  To identify and process feelings related to the disappointment of past painful events that increase worthless feelings. Verbally express understanding of the relationship between depressed mood and repression of feelings - such as anger, hurt, and sadness. Improve communication skills and boundary setting w/ her father   Assessment of progress:  progressing    Anson [place],  [facility]

## 2019-09-11 ENCOUNTER — Other Ambulatory Visit: Payer: Self-pay

## 2019-09-11 ENCOUNTER — Ambulatory Visit (INDEPENDENT_AMBULATORY_CARE_PROVIDER_SITE_OTHER): Payer: 59 | Admitting: Mental Health

## 2019-09-11 DIAGNOSIS — F4323 Adjustment disorder with mixed anxiety and depressed mood: Secondary | ICD-10-CM

## 2019-09-13 NOTE — Progress Notes (Signed)
Psychotherapy note  Name: Erin Boone Date: 09/11/19 MRN: [id number] DOB: 1971-02-25 PCP: Crecencio Mc, MD  Time spent: 56 minutes  Treatment : Individual therapy  Mental Status Exam:   Appearance:   Casual     Behavior:  Appropriate  Motor:  Normal  Speech/Language:   Normal Rate  Affect:   Constricted  Mood:  depressed  Thought process:  Coherent and Relevant  Thought content:    WDL and Logical  Perceptual disturbances:    Normal  Orientation:  Full (Time, Place, and Person)  Attention:  Good  Concentration:  good  Memory:  Immediate  Fund of knowledge:   Good  Insight:    Good  Judgment:   Good  Impulse Control:  good    Reported Symptoms: Irritability, depressed mood, anxiety  Risk Assessment: Danger to Self:  Noreviewed Self-injurious Behavior: No Danger to Others: No Duty to Warn:no Physical Aggression / Violence:No  Access to Firearms a concern: No  Gang Involvement:No  Patient / guardian was educated about steps to take if suicide or homicide risk level increases between visits: no While future psychiatric events cannot be accurately predicted, the patient does not currently require acute inpatient psychiatric care and does not currently meet [facility] involuntary commitment criteria.  Subjective:    Patient presents for session in no apparent distress, showing recent events.  She stated that she has spoken with her sisters and she plans to have a discussion with her father with her sisters present next week.  She articulated these plans and the primary reason for this discussion as well as also writing a letter for him to review to ensure that she is able to express her thoughts and feelings clearly to him.  She went on to focus on her marital relationship, details were shared centering around continued arguments which occur every few days.  She expressed needing to change her attempts to communicate and specifically how she would like her husband to  discontinue his alcohol use which she feels is a significant source of stress in the relationship.  When she plans to communicate in a relationship was explored specifically addressing her needs.  She identified the discovery, the potential benefits of separation due to the ongoing stress in the relationship.  Through discovery, she also processed thoughts and feelings related to her tendency to be in relationships to gain a sense of security and she has not been single for extended periods of time.  She plans to follow through with communication discussed today as well as continued contemplation of her role in relationships that relate to the sense of control.  Interventions: assessment, CBT, solution focused approaches  Diagnoses:    ICD-10-CM   1. Adjustment disorder with mixed anxiety and depressed mood  F43.23      Plan: Patient is to use CBT, mindfulness and coping skills to help manage decrease symptoms.  Patient to utilize communication skills discussed in session and her relationships, specifically with her husband and sharing her needs.   Long-term goal:   Reduce overall level, frequency, and intensity of the feelings of depression, anxiety up to 80% of the time per patient reports.  Reduce anger outbursts and coping evidenced by feeling less anger and "coldness" in her relationships w/ some family members.  Short-term goal:  To identify and process feelings related to the disappointment of past painful events that increase worthless feelings. Verbally express understanding of the relationship between depressed mood and repression of feelings - such  as anger, hurt, and sadness. Improve communication skills and boundary setting w/ her father   Assessment of progress:  progressing    Anson [place], [facility]

## 2019-09-25 ENCOUNTER — Ambulatory Visit (INDEPENDENT_AMBULATORY_CARE_PROVIDER_SITE_OTHER): Payer: 59 | Admitting: Mental Health

## 2019-09-25 ENCOUNTER — Other Ambulatory Visit: Payer: Self-pay

## 2019-09-25 DIAGNOSIS — F4323 Adjustment disorder with mixed anxiety and depressed mood: Secondary | ICD-10-CM | POA: Diagnosis not present

## 2019-09-25 NOTE — Progress Notes (Signed)
Psychotherapy note  Name: Erin Boone Date: 09/24/19 MRN: [id number] DOB: February 21, 1971 PCP: Crecencio Mc, MD  Time spent: 56 minutes  Treatment : Individual therapy  Mental Status Exam:   Appearance:   Casual     Behavior:  Appropriate  Motor:  Normal  Speech/Language:   Normal Rate  Affect:   Constricted  Mood:  depressed  Thought process:  Coherent and Relevant  Thought content:    WDL and Logical  Perceptual disturbances:    Normal  Orientation:  Full (Time, Place, and Person)  Attention:  Good  Concentration:  good  Memory:  Immediate  Fund of knowledge:   Good  Insight:    Good  Judgment:   Good  Impulse Control:  good    Reported Symptoms: Irritability, depressed mood, anxiety  Risk Assessment: Danger to Self:  Noreviewed Self-injurious Behavior: No Danger to Others: No Duty to Warn:no Physical Aggression / Violence:No  Access to Firearms a concern: No  Gang Involvement:No  Patient / guardian was educated about steps to take if suicide or homicide risk level increases between visits: no While future psychiatric events cannot be accurately predicted, the patient does not currently require acute inpatient psychiatric care and does not currently meet [facility] involuntary commitment criteria.  Subjective:    Patient shared progress, she stated that she and her sisters have not communicated with her father as discussed previously, I plan to talk to him in about 2 weeks to allow him time to celebrate his birthday.  She stated she has taken steps to clearly state to him her feelings when they made as well as writing a letter.  She went on to discuss continued relational strain in her marriage, she stated there was some recent breakthroughs with her husband as he verbalized the need to discontinue his drinking and how it is affected his relationships.  She shared how she is cautiously hopeful about his making changes.  She stated that his son is in counseling, how she  feels it is been helpful and encourages her husband to participate.  She shared ways she is taking steps toward self-care with starting a recent diet and beginning exercise.  She also has made progress with being more aware of her communication when she and her husband can have arguments where she has been successful in stopping her communication when she feels this not going to be effective with focus and engaging in discussions when both are calm and he is not using alcohol.  Interventions: assessment, CBT, solution focused approaches  Diagnoses:    ICD-10-CM   1. Adjustment disorder with mixed anxiety and depressed mood  F43.23      Plan: Patient is to use CBT, mindfulness and coping skills to help manage decrease symptoms.  Patient to utilize communication skills discussed in session and her relationships, specifically with her husband and sharing her needs.  Patient to continue to take steps toward self-care, focusing on her recent diet efforts as well as exercise.  Long-term goal:   Reduce overall level, frequency, and intensity of the feelings of depression, anxiety up to 80% of the time per patient reports.  Reduce anger outbursts and coping evidenced by feeling less anger and "coldness" in her relationships w/ some family members.  Short-term goal:  To identify and process feelings related to the disappointment of past painful events that increase worthless feelings. Verbally express understanding of the relationship between depressed mood and repression of feelings - such as anger, hurt,  and sadness. Improve communication skills and boundary setting w/ her father   Assessment of progress:  progressing    [place]son [place], [facility]

## 2019-10-09 ENCOUNTER — Other Ambulatory Visit: Payer: Self-pay

## 2019-10-09 ENCOUNTER — Ambulatory Visit (INDEPENDENT_AMBULATORY_CARE_PROVIDER_SITE_OTHER): Payer: 59 | Admitting: Mental Health

## 2019-10-09 DIAGNOSIS — F4323 Adjustment disorder with mixed anxiety and depressed mood: Secondary | ICD-10-CM | POA: Diagnosis not present

## 2019-10-11 NOTE — Progress Notes (Signed)
Psychotherapy note  Name: Erin Boone Date: 10/09/19 MRN: [id number] DOB: Nov 20, 1970 PCP: Crecencio Mc, MD  Time spent: 54 minutes  Treatment : Individual therapy  Mental Status Exam:   Appearance:   Casual     Behavior:  Appropriate  Motor:  Normal  Speech/Language:   Normal Rate  Affect:   Constricted  Mood:  depressed  Thought process:  Coherent and Relevant  Thought content:    WDL and Logical  Perceptual disturbances:    Normal  Orientation:  Full (Time, Place, and Person)  Attention:  Good  Concentration:  good  Memory:  Immediate  Fund of knowledge:   Good  Insight:    Good  Judgment:   Good  Impulse Control:  good    Reported Symptoms: Irritability, depressed mood, anxiety  Risk Assessment: Danger to Self:  Noreviewed Self-injurious Behavior: No Danger to Others: No Duty to Warn:no Physical Aggression / Violence:No  Access to Firearms a concern: No  Gang Involvement:No  Patient / guardian was educated about steps to take if suicide or homicide risk level increases between visits: no While future psychiatric events cannot be accurately predicted, the patient does not currently require acute inpatient psychiatric care and does not currently meet [facility] involuntary commitment criteria.  Subjective:    Patient presents for session, sharing progress.  She stated that she is going to talk to her father today and has prepared a letter as well.  She stated that her older sister is also going to be there with her as they confront him about some behaviors he has had with another family member.  She shared details of how she is going to convey the statements to him and is hopeful that he will listen.  She is making this effort to protect the family as well as confront him in the hopes that he will be honest which has been problematic in the past. She shared ongoing marital stress, how her husband continues to use alcohol excessively, however, she is hopeful he will  makes changes and he has verbalized as such. Through guided discovery, she identified effective communication she has been able to follow through on when they have difficult discussions and she is plans to continue. She also has taken steps toward self care via exercise recently. She feels she is being more true to self recently and this has been self-motivating.   Interventions: assessment, CBT, solution focused approaches  Diagnoses:    ICD-10-CM   1. Adjustment disorder with mixed anxiety and depressed mood  F43.23      Plan: Patient is to use CBT, mindfulness and coping skills to help manage decrease symptoms.  Patient to utilize communication skills discussed in session and her relationships, specifically with her husband and sharing her needs.  Patient to continue to take steps toward self-care, focusing on her recent diet efforts as well as exercise.  Long-term goal:   Reduce overall level, frequency, and intensity of the feelings of depression, anxiety up to 80% of the time per patient reports.  Reduce anger outbursts and coping evidenced by feeling less anger and "coldness" in her relationships w/ some family members.  Short-term goal:  To identify and process feelings related to the disappointment of past painful events that increase worthless feelings. Verbally express understanding of the relationship between depressed mood and repression of feelings - such as anger, hurt, and sadness. Improve communication skills and boundary setting w/ her father   Assessment of progress:  progressing  Anson [place], [facility]

## 2019-10-23 ENCOUNTER — Ambulatory Visit: Payer: 59 | Admitting: Mental Health

## 2019-11-06 ENCOUNTER — Ambulatory Visit (INDEPENDENT_AMBULATORY_CARE_PROVIDER_SITE_OTHER): Payer: 59 | Admitting: Mental Health

## 2019-11-06 ENCOUNTER — Other Ambulatory Visit: Payer: Self-pay

## 2019-11-06 DIAGNOSIS — F4323 Adjustment disorder with mixed anxiety and depressed mood: Secondary | ICD-10-CM

## 2019-11-06 NOTE — Progress Notes (Signed)
Psychotherapy note  Name: Erin Boone Date: 11/06/19 MRN: [id number] DOB: Jun 30, 1970 PCP: Crecencio Mc, MD  Time spent: 63 minutes  Treatment : Individual therapy  Mental Status Exam:   Appearance:   Casual     Behavior:  Appropriate  Motor:  Normal  Speech/Language:   Normal Rate  Affect:   Constricted  Mood:  depressed  Thought process:  Coherent and Relevant  Thought content:    WDL and Logical  Perceptual disturbances:    Normal  Orientation:  Full (Time, Place, and Person)  Attention:  Good  Concentration:  good  Memory:  Immediate  Fund of knowledge:   Good  Insight:    Good  Judgment:   Good  Impulse Control:  good    Reported Symptoms: Irritability, depressed mood, anxiety  Risk Assessment: Danger to Self:  No Self-injurious Behavior: No Danger to Others: No Duty to Warn:no Physical Aggression / Violence:No  Access to Firearms a concern: No  Gang Involvement:No  Patient / guardian was educated about steps to take if suicide or homicide risk level increases between visits: no While future psychiatric events cannot be accurately predicted, the patient does not currently require acute inpatient psychiatric care and does not currently meet [facility] involuntary commitment criteria.  Subjective:    Patient shared how she and her sister had the discussion with her father as discussed in previous sessions.  Most of the session was centered around the effects of this discussion, her father's reactions, its impact on family relationships and what she wants from the relationship going forward.  She identified wanting her father to be caring by asking about her day or how her life is going versus talking about himself which is more often his tendency.  She stated that he did not take accountability when she confronted him during the discussion and as a result, they have not spoken for the past month.  She identified the need to not reach out to him, that if he wants a  relationship with her he is going to have to take this step.  Although she wants a relationship with him going forward she is careful with her expectations due to his long history of not being trustworthy and self-centered.  She shared how she and her husband continue to have strain in their relationship, that he continues to drink frequently and excessive amounts.  She stated that he did identify being open to individual counseling for himself as she feels this would be beneficial as well.  She identified through discovery, "codependent in relationships" and being a "fixer" and how this relates to her relationship with her father.  Encouraged her to consider what she obtains from these tendencies in relationships.  Interventions: assessment, CBT, solution focused approaches  Diagnoses:    ICD-10-CM   1. Adjustment disorder with mixed anxiety and depressed mood  F43.23      Plan: Patient is to use CBT, mindfulness and coping skills to help manage decrease symptoms.  Patient to utilize communication skills discussed in session and her relationships, specifically with her husband and sharing her needs.  Patient to continue to take steps toward self-care, improved health outcomes by engaging in exercise as this is one of her identified outlets. Long-term goal:   Reduce overall level, frequency, and intensity of the feelings of depression, anxiety up to 80% of the time per patient reports.  Reduce anger outbursts and coping evidenced by feeling less anger and "coldness" in her relationships w/  some family members.  Short-term goal:  To identify and process feelings related to the disappointment of past painful events that increase sad or worthless feelings. Verbally express understanding of the relationship between depressed mood and repression of feelings - such as anger, hurt, and sadness. Improve communication skills and boundary setting w/ her father Improve self insight resulting in identifying  and communicating needs and her marital relationship    Assessment of progress:  progressing    Anson [place], [facility]

## 2019-11-20 ENCOUNTER — Ambulatory Visit (INDEPENDENT_AMBULATORY_CARE_PROVIDER_SITE_OTHER): Payer: 59 | Admitting: Mental Health

## 2019-11-20 ENCOUNTER — Other Ambulatory Visit: Payer: Self-pay

## 2019-11-20 DIAGNOSIS — F4323 Adjustment disorder with mixed anxiety and depressed mood: Secondary | ICD-10-CM

## 2019-11-20 NOTE — Progress Notes (Signed)
Psychotherapy note  Name: Erin Boone Date: 11/20/19 MRN: [id number] DOB: 05/25/1971 PCP: Crecencio Mc, MD  Time spent: 58 minutes  Treatment : Individual therapy  Mental Status Exam:   Appearance:   Casual     Behavior:  Appropriate  Motor:  Normal  Speech/Language:   Normal Rate  Affect:   Constricted  Mood:  depressed  Thought process:  Coherent and Relevant  Thought content:    WDL and Logical  Perceptual disturbances:    Normal  Orientation:  Full (Time, Place, and Person)  Attention:  Good  Concentration:  good  Memory:  Immediate  Fund of knowledge:   Good  Insight:    Good  Judgment:   Good  Impulse Control:  good    Reported Symptoms: Irritability, depressed mood, anxiety  Risk Assessment: Danger to Self:  No Self-injurious Behavior: No Danger to Others: No Duty to Warn:no Physical Aggression / Violence:No  Access to Firearms a concern: No  Gang Involvement:No  Patient / guardian was educated about steps to take if suicide or homicide risk level increases between visits: no While future psychiatric events cannot be accurately predicted, the patient does not currently require acute inpatient psychiatric care and does not currently meet [facility] involuntary commitment criteria.  Subjective:    Patient presents for session in no distress.  She stated that she continues to have no contact with her father, that he called recently but did not leave a message so she called him back but he did not answer.  She stated that he is spending a lot of money as she monitors his banking account and he is aware of this.  She shared how she has had continued relational stress with her boyfriend as she stated he continues to use alcohol excessively.  She says she followed up by letting him know about the resource the alcohol experiment and how she reviewed the content as well finding that helpful.  She continues to express how she needs this relationship to change, that they  have frequent arguments, that he will often accuse her being unfaithful she feels due to his past relationships.  She stated he needs constant reassurance and she feels he would benefit from therapy but he has been resistant.  She continues to question while she finds her self and relationships that have some similarities centered around "fixing others".  The discovery she related this back to how her and her sisters lives changed significantly when her father went to prison and how she had to be the sibling that tried to manage how this affected family, her sisters.  She plans to follow through with communication strategies discussed when communicating with her boyfriend about her needs.   Interventions: assessment, CBT, solution focused approaches  Diagnoses:    ICD-10-CM   1. Adjustment disorder with mixed anxiety and depressed mood  F43.23      Plan: Patient is to use CBT, mindfulness and coping skills to help manage decrease symptoms.  Patient to utilize communication skills discussed in session and her relationships, specifically with her husband and sharing her needs.  Patient to continue to take steps toward self-care, improved health outcomes by engaging in exercise as this is one of her identified outlets. Long-term goal:   Reduce overall level, frequency, and intensity of the feelings of depression, anxiety up to 80% of the time per patient reports.  Reduce anger outbursts and coping evidenced by feeling less anger and "coldness" in her relationships w/  some family members.  Short-term goal:  To identify and process feelings related to the disappointment of past painful events that increase sad or worthless feelings. Verbally express understanding of the relationship between depressed mood and repression of feelings - such as anger, hurt, and sadness. Improve communication skills and boundary setting w/ her father Improve self insight resulting in identifying and communicating needs and  her marital relationship    Assessment of progress:  progressing    Anson [place], [facility]

## 2019-12-04 ENCOUNTER — Ambulatory Visit: Payer: 59 | Admitting: Mental Health

## 2019-12-25 ENCOUNTER — Ambulatory Visit (INDEPENDENT_AMBULATORY_CARE_PROVIDER_SITE_OTHER): Payer: 59 | Admitting: Mental Health

## 2019-12-25 ENCOUNTER — Other Ambulatory Visit: Payer: Self-pay

## 2019-12-25 DIAGNOSIS — F4323 Adjustment disorder with mixed anxiety and depressed mood: Secondary | ICD-10-CM | POA: Diagnosis not present

## 2019-12-25 NOTE — Progress Notes (Signed)
Psychotherapy note  Name: Erin Boone Date: 12/25/19 MRN: [id number] DOB: Apr 16, 1971 PCP: Crecencio Mc, MD  Time spent: 58 minutes  Treatment : Individual therapy  Mental Status Exam:   Appearance:   Casual     Behavior:  Appropriate  Motor:  Normal  Speech/Language:   Normal Rate  Affect:   Constricted  Mood:  depressed  Thought process:  Coherent and Relevant  Thought content:    WDL and Logical  Perceptual disturbances:    Normal  Orientation:  Full (Time, Place, and Person)  Attention:  Good  Concentration:  good  Memory:  Immediate  Fund of knowledge:   Good  Insight:    Good  Judgment:   Good  Impulse Control:  good    Reported Symptoms: Irritability, depressed mood, anxiety  Risk Assessment: Danger to Self:  No Self-injurious Behavior: No Danger to Others: No Duty to Warn:no Physical Aggression / Violence:No  Access to Firearms a concern: No  Gang Involvement:No  Patient / guardian was educated about steps to take if suicide or homicide risk level increases between visits: no While future psychiatric events cannot be accurately predicted, the patient does not currently require acute inpatient psychiatric care and does not currently meet [facility] involuntary commitment criteria.  Subjective:    Patient presents for session, sharing progress.  Continues to have no contact with her father since the past discussion or she confronted him on some of his behaviors with her niece.  She stated "I am okay with not talking to him, having that talk clarified things and I just could not move lies anymore".  She went on to share some history related to her father's long history of lying, deception which ultimately landed him in prison for several years.  Continues to process stress related to her relationship with her boyfriend.  She stated that he continues to have insecurities, needing frequent validation from her.  Also continues to accuse her of being unfaithful with  no logical reasoning, more that this comes from his profound insecurities. She shared he has a history of being cheated on by an ex girlfriend as an example. Challenged her to identify what specific needs are being met for herself in this relationship; also, what specifically keeps her in relationship. She did identify today feeling uncomfortable with the through of being alone "I have always restarted relationships soon after ending previous ones. She plans to follow through w/ homework in this area between sessions.    Interventions: assessment, CBT, solution focused approaches  Diagnoses:    ICD-10-CM   1. Adjustment disorder with mixed anxiety and depressed mood  F43.23      Plan: Patient is to use CBT, mindfulness and coping skills to help manage decrease symptoms.  Patient to follow through on homework on identifying specific interpersonal needs being met by staying in her current relationship with her boyfriend as she shared how she often identifies his problematic behaviors. Patient to continue to take steps toward self-care, improved health outcomes by engaging in exercise as this is one of her identified outlets. Long-term goal:   Reduce overall level, frequency, and intensity of the feelings of depression, anxiety up to 80% of the time per patient reports.  Reduce anger outbursts and coping evidenced by feeling less anger and "coldness" in her relationships w/ some family members.  Short-term goal:  To identify and process feelings related to the disappointment of past painful events that increase sad or worthless feelings. Verbally express  understanding of the relationship between depressed mood and repression of feelings - such as anger, hurt, and sadness. Improve communication skills and boundary setting w/ her father Improve self insight resulting in identifying and communicating needs and her marital relationship    Assessment of progress:  progressing    Anson [place], [facility]

## 2020-01-08 ENCOUNTER — Ambulatory Visit: Payer: 59 | Admitting: Mental Health

## 2020-01-22 ENCOUNTER — Ambulatory Visit: Payer: 59 | Admitting: Mental Health

## 2020-02-04 LAB — HM PAP SMEAR: HM Pap smear: NORMAL

## 2020-02-04 LAB — HM MAMMOGRAPHY

## 2020-02-12 ENCOUNTER — Ambulatory Visit: Payer: 59 | Admitting: Mental Health

## 2020-03-17 ENCOUNTER — Ambulatory Visit: Payer: 59 | Admitting: Mental Health

## 2020-04-08 ENCOUNTER — Ambulatory Visit: Payer: 59 | Admitting: Mental Health

## 2020-04-29 ENCOUNTER — Encounter: Payer: 59 | Admitting: Internal Medicine

## 2020-05-09 ENCOUNTER — Encounter: Payer: 59 | Admitting: Internal Medicine

## 2020-05-27 ENCOUNTER — Encounter: Payer: 59 | Admitting: Internal Medicine

## 2020-07-08 ENCOUNTER — Encounter: Payer: Self-pay | Admitting: Internal Medicine

## 2020-07-15 ENCOUNTER — Encounter: Payer: Self-pay | Admitting: Internal Medicine

## 2020-08-12 ENCOUNTER — Ambulatory Visit (INDEPENDENT_AMBULATORY_CARE_PROVIDER_SITE_OTHER): Payer: Managed Care, Other (non HMO) | Admitting: Internal Medicine

## 2020-08-12 ENCOUNTER — Other Ambulatory Visit: Payer: Self-pay

## 2020-08-12 ENCOUNTER — Encounter: Payer: Self-pay | Admitting: Internal Medicine

## 2020-08-12 VITALS — BP 108/76 | HR 79 | Temp 97.8°F | Ht 64.02 in | Wt 175.8 lb

## 2020-08-12 DIAGNOSIS — E663 Overweight: Secondary | ICD-10-CM | POA: Diagnosis not present

## 2020-08-12 DIAGNOSIS — Z113 Encounter for screening for infections with a predominantly sexual mode of transmission: Secondary | ICD-10-CM | POA: Diagnosis not present

## 2020-08-12 DIAGNOSIS — Z0000 Encounter for general adult medical examination without abnormal findings: Secondary | ICD-10-CM

## 2020-08-12 DIAGNOSIS — E559 Vitamin D deficiency, unspecified: Secondary | ICD-10-CM | POA: Diagnosis not present

## 2020-08-12 DIAGNOSIS — E538 Deficiency of other specified B group vitamins: Secondary | ICD-10-CM | POA: Diagnosis not present

## 2020-08-12 DIAGNOSIS — Z23 Encounter for immunization: Secondary | ICD-10-CM

## 2020-08-12 LAB — COMPREHENSIVE METABOLIC PANEL
ALT: 19 U/L (ref 0–35)
AST: 18 U/L (ref 0–37)
Albumin: 4.4 g/dL (ref 3.5–5.2)
Alkaline Phosphatase: 48 U/L (ref 39–117)
BUN: 9 mg/dL (ref 6–23)
CO2: 26 mEq/L (ref 19–32)
Calcium: 9.3 mg/dL (ref 8.4–10.5)
Chloride: 102 mEq/L (ref 96–112)
Creatinine, Ser: 0.78 mg/dL (ref 0.40–1.20)
GFR: 89.3 mL/min (ref >60.00)
Glucose, Bld: 83 mg/dL (ref 70–99)
Potassium: 4.2 mEq/L (ref 3.5–5.1)
Sodium: 138 mEq/L (ref 135–145)
Total Bilirubin: 0.4 mg/dL (ref 0.2–1.2)
Total Protein: 7.2 g/dL (ref 6.0–8.3)

## 2020-08-12 LAB — VITAMIN B12: Vitamin B-12: 304 pg/mL (ref 211–911)

## 2020-08-12 LAB — VITAMIN D 25 HYDROXY (VIT D DEFICIENCY, FRACTURES): VITD: 23.13 ng/mL — ABNORMAL LOW (ref 30.00–100.00)

## 2020-08-12 LAB — TSH: TSH: 1.42 u[IU]/mL (ref 0.35–4.50)

## 2020-08-12 MED ORDER — CELECOXIB 200 MG PO CAPS: 200.0000 mg | ORAL_CAPSULE | Freq: Two times a day (BID) | ORAL | 1 refills | Status: DC

## 2020-08-12 NOTE — Patient Instructions (Addendum)
Consider going to AA FOR YOURSELF    celebrex 20 mg twice daily for one week,  Then reduce to once daily   If tolerated   You can add up to 2000 mg of acetominophen (tylenol) every day safely  In divided doses (500 mg every 6 hours  Or 1000 mg every 12 hours.)   Activity modfication.  Wear the brace.  No lifting anything heavier than a cup of coffee.  IF NO IMPROVEMENT IN 3 weeks,  Let me know (ortho referral )    Health Maintenance, Female Adopting a healthy lifestyle and getting preventive care are important in promoting health and wellness. Ask your health care provider about:  The right schedule for you to have regular tests and exams.  Things you can do on your own to prevent diseases and keep yourself healthy. What should I know about diet, weight, and exercise? Eat a healthy diet  Eat a diet that includes plenty of vegetables, fruits, low-fat dairy products, and lean protein.  Do not eat a lot of foods that are high in solid fats, added sugars, or sodium.   Maintain a healthy weight Body mass index (BMI) is used to identify weight problems. It estimates body fat based on height and weight. Your health care provider can help determine your BMI and help you achieve or maintain a healthy weight. Get regular exercise Get regular exercise. This is one of the most important things you can do for your health. Most adults should:  Exercise for at least 150 minutes each week. The exercise should increase your heart rate and make you sweat (moderate-intensity exercise).  Do strengthening exercises at least twice a week. This is in addition to the moderate-intensity exercise.  Spend less time sitting. Even light physical activity can be beneficial. Watch cholesterol and blood lipids Have your blood tested for lipids and cholesterol at 50 years of age, then have this test every 5 years. Have your cholesterol levels checked more often if:  Your lipid or cholesterol levels are  high.  You are older than 50 years of age.  You are at high risk for heart disease. What should I know about cancer screening? Depending on your health history and family history, you may need to have cancer screening at various ages. This may include screening for:  Breast cancer.  Cervical cancer.  Colorectal cancer.  Skin cancer.  Lung cancer. What should I know about heart disease, diabetes, and high blood pressure? Blood pressure and heart disease  High blood pressure causes heart disease and increases the risk of stroke. This is more likely to develop in people who have high blood pressure readings, are of African descent, or are overweight.  Have your blood pressure checked: ? Every 3-5 years if you are 32-16 years of age. ? Every year if you are 64 years old or older. Diabetes Have regular diabetes screenings. This checks your fasting blood sugar level. Have the screening done:  Once every three years after age 61 if you are at a normal weight and have a low risk for diabetes.  More often and at a younger age if you are overweight or have a high risk for diabetes. What should I know about preventing infection? Hepatitis B If you have a higher risk for hepatitis B, you should be screened for this virus. Talk with your health care provider to find out if you are at risk for hepatitis B infection. Hepatitis C Testing is recommended for:  Everyone  born from [birth year] through 1965.  Anyone with known risk factors for hepatitis C. Sexually transmitted infections (STIs)  Get screened for STIs, including gonorrhea and chlamydia, if: ? You are sexually active and are younger than 50 years of age. ? You are older than 50 years of age and your health care provider tells you that you are at risk for this type of infection. ? Your sexual activity has changed since you were last screened, and you are at increased risk for chlamydia or gonorrhea. Ask your health care provider if you  are at risk.  Ask your health care provider about whether you are at high risk for HIV. Your health care provider may recommend a prescription medicine to help prevent HIV infection. If you choose to take medicine to prevent HIV, you should first get tested for HIV. You should then be tested every 3 months for as long as you are taking the medicine. Pregnancy  If you are about to stop having your period (premenopausal) and you may become pregnant, seek counseling before you get pregnant.  Take 400 to 800 micrograms (mcg) of folic acid every day if you become pregnant.  Ask for birth control (contraception) if you want to prevent pregnancy. Osteoporosis and menopause Osteoporosis is a disease in which the bones lose minerals and strength with aging. This can result in bone fractures. If you are 10 years old or older, or if you are at risk for osteoporosis and fractures, ask your health care provider if you should:  Be screened for bone loss.  Take a calcium or vitamin D supplement to lower your risk of fractures.  Be given hormone replacement therapy (HRT) to treat symptoms of menopause. Follow these instructions at home: Lifestyle  Do not use any products that contain nicotine or tobacco, such as cigarettes, e-cigarettes, and chewing tobacco. If you need help quitting, ask your health care provider.  Do not use street drugs.  Do not share needles.  Ask your health care provider for help if you need support or information about quitting drugs. Alcohol use  Do not drink alcohol if: ? Your health care provider tells you not to drink. ? You are pregnant, may be pregnant, or are planning to become pregnant.  If you drink alcohol: ? Limit how much you use to 0-1 drink a day. ? Limit intake if you are breastfeeding.  Be aware of how much alcohol is in your drink. In the [place] one drink equals one 12 oz bottle of beer (355 mL), one 5 oz glass of wine (148 mL), or one 1 oz glass of hard  liquor (44 mL). General instructions  Schedule regular health, dental, and eye exams.  Stay current with your vaccines.  Tell your health care provider if: ? You often feel depressed. ? You have ever been abused or do not feel safe at home. Summary  Adopting a healthy lifestyle and getting preventive care are important in promoting health and wellness.  Follow your health care provider's instructions about healthy diet, exercising, and getting tested or screened for diseases.  Follow your health care provider's instructions on monitoring your cholesterol and blood pressure. This information is not intended to replace advice given to you by your health care provider. Make sure you discuss any questions you have with your health care provider. Document Revised: 05/28/2018 Document Reviewed: 05/28/2018 Elsevier Patient Education  2021 [employer].

## 2020-08-12 NOTE — Progress Notes (Signed)
Patient ID: Erin Boone, female    DOB: 01/29/1971  Age: 50 y.o. MRN: [id number]  The patient is here for annual preventive examination and management of other chronic and acute problems.  CERVICAL CA AND BREAST CA SCREENING DONE BY GYN  DR TAAVO IN [place]   Feb 04 2020  3 D NORMAL  PAP NORMAL      The risk factors are reflected in the social history.  The roster of all physicians providing medical care to patient - is listed in the Snapshot section of the chart.  Activities of daily living:  The patient is 100% independent in all ADLs: dressing, toileting, feeding as well as independent mobility  Home safety : The patient has smoke detectors in the home. They wear seatbelts.  There are no firearms at home. There is no violence in the home.   There is no risks for hepatitis, STDs or HIV. There is no   history of blood transfusion. They have no travel history to infectious disease endemic areas of the world.  The patient has seen their dentist in the last six month. They have seen their eye doctor in the last year. She denies hearing difficulty with regard to whispered voices and some television programs.  They have deferred audiologic testing in the last year.  They do not  have excessive sun exposure. Discussed the need for sun protection: hats, long sleeves and use of sunscreen if there is significant sun exposure.   Diet: the importance of a healthy diet is discussed. They do have a healthy diet.  The benefits of regular aerobic exercise were discussed. She walks 4 times per week ,  20 minutes.   Depression screen: there are no signs or vegative symptoms of depression- irritability, change in appetite, anhedonia, sadness/tearfullness.  The following portions of the patient's history were reviewed and updated as appropriate: allergies, current medications, past family history, past medical history,  past surgical history, past social history  and problem list.  Visual acuity was not assessed  per patient preference since she has regular follow up with her ophthalmologist. Hearing and body mass index were assessed and reviewed.   During the course of the visit the patient was educated and counseled about appropriate screening and preventive services including : fall prevention , diabetes screening, nutrition counseling, colorectal cancer screening, and recommended immunizations.    CC: The primary encounter diagnosis was B12 deficiency. Diagnoses of Vitamin D deficiency, Overweight (BMI 25.0-29.9), Screen for STD (sexually transmitted disease), COVID-19 vaccine administered, and Routine general medical examination at a health care facility were also pertinent to this visit.   1) right lateral elbow pain   For several weeks.  aggravated by reaching for heavy purse behind her in car.   2)  INTERMITTENT DRY COUGH .  VARIOUS TIMES. Marland Kitchen    3) ATKINS DIET STARTED,  HAS LOST TEN LBS.   Not exercising yet. Reflux better   4) Mood disorder:  She was seeing a Retail banker  FOR ADJUSTMENT DISORDER brought on by family stressors:  .  FATHER WAS LIVING WITH HER AFTER BEING RELEASED FROM PRISON AFTER 19 YEARS.  Mother died while father was in prison.  Anger .  Feeling Better now.  Remains in contact with  ex husband Erin Boone,  Whom she divorced because of his untreated alcoholism .  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   She has a past surgical history that includes Eye surgery and Tonsillectomy and adenoidectomy (Bilateral, 1987).   Her family history includes Arthritis in her father; Breast cancer (age of onset: 51) in her mother; Dementia in her mother; Diabetes  in her paternal aunt, paternal grandfather, paternal grandmother, paternal uncle, and sister; Heart disease in her maternal grandfather; Hypertension in her brother, father, and mother; Other in her father; Uterine cancer in her mother.She reports that she has never smoked. She has never used smokeless tobacco. She reports current alcohol use. She reports that she does not use drugs.  Outpatient Medications Prior to Visit  Medication Sig Dispense Refill  . Biotin 1 MG CAPS Take 1 capsule by mouth daily.    . Levonorgestrel-Ethinyl Estradiol (AMETHIA,CAMRESE) 0.1-0.02 & 0.01 MG tablet Take 1 tablet by mouth daily.     . benzonatate (TESSALON) 100 MG capsule Take 1 capsule (100 mg total) by mouth 3 (three) times daily as needed for cough. 30 capsule 0  . nystatin (MYCOSTATIN/NYSTOP) powder Apply topically 2 (two) times daily. 15 g 4  . Vitamin D, Ergocalciferol, (DRISDOL) 1.25 MG (50000 UT) CAPS capsule TAKE 1 CAPSULE (50,000 UNITS TOTAL) BY MOUTH EVERY 7 (SEVEN) DAYS. 12 capsule 0   No facility-administered medications prior to visit.    Review of Systems   Patient denies headache, fevers, malaise, unintentional weight loss, skin rash, eye pain, sinus congestion and sinus pain, sore throat, dysphagia,  hemoptysis , cough, dyspnea, wheezing, chest pain, palpitations, orthopnea, edema, abdominal pain, nausea, melena, diarrhea, constipation, flank pain, dysuria, hematuria, urinary  Frequency, nocturia, numbness, tingling, seizures,  Focal weakness, Loss of consciousness,  Tremor, insomnia, depression, anxiety, and suicidal ideation.      Objective:  BP 108/76 (BP Location: Left Arm, Patient Position: Sitting)   Pulse 79   Temp 97.8 F (36.6 C)   Ht 5' 4.02" (1.626 m)   Wt 175 lb 12.8 oz (79.7 kg)   SpO2 98%   BMI 30.16 kg/m   Physical Exam  General appearance: alert, cooperative and appears stated age Ears: normal TM's and external ear canals both ears Throat: lips, mucosa, and tongue  normal; teeth and gums normal Neck: no adenopathy, no carotid bruit, supple, symmetrical, trachea midline and thyroid not enlarged, symmetric, no tenderness/mass/nodules Back: symmetric, no curvature. ROM normal. No CVA tenderness. Lungs: clear to auscultation bilaterally Heart: regular rate and rhythm, S1, S2 normal, no murmur, click, rub or gallop Abdomen: soft, non-tender; bowel sounds normal; no masses,  no organomegaly Pulses: 2+ and symmetric Skin: Skin color, texture, turgor normal. No rashes or lesions Lymph nodes: Cervical, supraclavicular, and axillary nodes normal.  Assessment & Plan:   Problem List Items Addressed This Visit      Unprioritized   Vitamin D deficiency    Recurrent  Will likely need mega dosing every winter       Relevant Orders   VITAMIN D 25 Hydroxy (Vit-D Deficiency, Fractures) (Completed)   Routine general medical examination at a health care facility    age appropriate education and counseling updated, referrals for preventative services and immunizations addressed, dietary and smoking counseling addressed, most recent labs reviewed.  I have personally reviewed and have noted:  1) the patient's medical and social history 2) The pt's use of alcohol, tobacco, and illicit  drugs 3) The patient's current medications and supplements 4) Functional ability including ADL's, fall risk, home safety risk, hearing and visual impairment 5) Diet and physical activities 6) Evidence for depression or mood disorder 7) The patient's height, weight, and BMI have been recorded in the chart  I have made referrals, and provided counseling and education based on review of the above      Overweight (BMI 25.0-29.9)    I have discussed and reviewed  her  BMI and recommended a low glycemic index diet utilizing smaller more frequent meals to increase metabolism.  I have also recommended that patient start exercising with a goal of 30 minutes of aerobic exercise a minimum of 5  days per week. Screening for thyroid and diabetes was done today.  Lab Results  Component Value Date   TSH 1.42 08/12/2020   No results found for: HGBA1C        Relevant Orders   Comprehensive metabolic panel (Completed)   TSH (Completed)   B12 deficiency - Primary    Advised to resume supplementation at 2500 mcg daily   Lab Results  Component Value Date   VITAMINB12 304 08/12/2020         Relevant Orders   Vitamin B12 (Completed)    Other Visit Diagnoses    Screen for STD (sexually transmitted disease)       Relevant Orders   HIV Antibody (routine testing w rflx)   Hepatitis C antibody   COVID-19 vaccine administered       Relevant Orders   SARS-CoV-2 Semi-Quantitative Total Antibody, Spike      I have discontinued Gregory D. Wirt's nystatin, benzonatate, and Vitamin D (Ergocalciferol). I am also having her start on celecoxib and ergocalciferol. Additionally, I am having her maintain her Levonorgestrel-Ethinyl Estradiol and Biotin.  Meds ordered this encounter  Medications  . celecoxib (CELEBREX) 200 MG capsule    Sig: Take 1 capsule (200 mg total) by mouth 2 (two) times daily.    Dispense:  60 capsule    Refill:  1  . ergocalciferol (DRISDOL) 1.25 MG (50000 UT) capsule    Sig: Take 1 capsule (50,000 Units total) by mouth once a week.    Dispense:  12 capsule    Refill:  0    Medications Discontinued During This Encounter  Medication Reason  . benzonatate (TESSALON) 100 MG capsule Error  . nystatin (MYCOSTATIN/NYSTOP) powder Error  . Vitamin D, Ergocalciferol, (DRISDOL) 1.25 MG (50000 UT) CAPS capsule Error    Follow-up: No follow-ups on file.   Crecencio Mc, MD

## 2020-08-13 ENCOUNTER — Encounter: Payer: Self-pay | Admitting: Internal Medicine

## 2020-08-13 DIAGNOSIS — E559 Vitamin D deficiency, unspecified: Secondary | ICD-10-CM | POA: Insufficient documentation

## 2020-08-13 MED ORDER — ERGOCALCIFEROL 1.25 MG (50000 UT) PO CAPS: 50000.0000 [IU] | ORAL_CAPSULE | ORAL | 0 refills | Status: DC

## 2020-08-13 NOTE — Assessment & Plan Note (Signed)
Recurrent  Will likely need mega dosing every winter

## 2020-08-13 NOTE — Assessment & Plan Note (Signed)

## 2020-08-13 NOTE — Assessment & Plan Note (Signed)
I have discussed and reviewed  her  BMI and recommended a low glycemic index diet utilizing smaller more frequent meals to increase metabolism.  I have also recommended that patient start exercising with a goal of 30 minutes of aerobic exercise a minimum of 5 days per week. Screening for thyroid and diabetes was done today.  Lab Results  Component Value Date   TSH 1.42 08/12/2020   No results found for: HGBA1C

## 2020-08-13 NOTE — Assessment & Plan Note (Signed)
Advised to resume supplementation at 2500 mcg daily   Lab Results  Component Value Date   VITAMINB12 304 08/12/2020

## 2020-08-15 LAB — HEPATITIS C ANTIBODY
Hepatitis C Ab: NONREACTIVE
SIGNAL TO CUT-OFF: 0.01 (ref <1.00)

## 2020-08-15 LAB — HIV ANTIBODY (ROUTINE TESTING W REFLEX): HIV 1&2 Ab, 4th Generation: NONREACTIVE

## 2020-08-16 LAB — SARS-COV-2 SEMI-QUANTITATIVE TOTAL ANTIBODY, SPIKE: SARS COV2 AB, Total Spike Semi QN: >2500 U/mL — ABNORMAL HIGH (ref <0.8)

## 2020-08-19 ENCOUNTER — Encounter: Payer: Self-pay | Admitting: Internal Medicine

## 2020-09-23 ENCOUNTER — Telehealth: Payer: Self-pay | Admitting: Mental Health

## 2020-09-23 NOTE — Telephone Encounter (Signed)
Erin Boone has scheduled a visit with you on 10/11/20. She asked if her boyfriend could come in the room for a little while for the visit. I told her I would have to check with you and can let her know. I told her I'd call her back to inform.

## 2020-10-11 ENCOUNTER — Other Ambulatory Visit: Payer: Self-pay

## 2020-10-11 ENCOUNTER — Ambulatory Visit (INDEPENDENT_AMBULATORY_CARE_PROVIDER_SITE_OTHER): Payer: 59 | Admitting: Mental Health

## 2020-10-11 DIAGNOSIS — F4323 Adjustment disorder with mixed anxiety and depressed mood: Secondary | ICD-10-CM

## 2020-10-11 NOTE — Progress Notes (Signed)
Psychotherapy note  Name: Erin Boone Date: 10/11/20 MRN: [id number] DOB: 22-Mar-1971 PCP: Crecencio Mc, MD  Time spent: 57 minutes  Treatment : Individual therapy  Mental Status Exam:   Appearance:   Casual     Behavior:  Appropriate  Motor:  Normal  Speech/Language:   Normal Rate  Affect:  Full range  Mood:  Anxious, pleasant  Thought process:  Coherent and Relevant  Thought content:    WDL and Logical  Perceptual disturbances:    Normal  Orientation:  Full (Time, Place, and Person)  Attention:  Good  Concentration:  good  Memory:  intact  Fund of knowledge:   Good  Insight:    Good  Judgment:   Good  Impulse Control:  good    Reported Symptoms: Irritability, depressed mood, anxiety, sad feelings  Risk Assessment: Danger to Self:  No Self-injurious Behavior: No Danger to Others: No Duty to Warn:no Physical Aggression / Violence:No  Access to Firearms a concern: No  Gang Involvement:No  Patient / guardian was educated about steps to take if suicide or homicide risk level increases between visits: no While future psychiatric events cannot be accurately predicted, the patient does not currently require acute inpatient psychiatric care and does not currently meet [facility] involuntary commitment criteria.  Subjective:    Patient presents for session sharing events and changes since last visit which was about 8 months ago.  She states she continues to have relationship challenges with her boyfriend who continues to reside with her.  She went on to share details, his continued alcohol use which can lead to several hours into the early mornings, his working inconsistently at his job.  She continues to pay all the household bills and shared efforts to talk with her boyfriend about needed changes from him for their relationship to persist.  She questions if she needs to stay in this relationship while also is open to the possibility that couples counseling might be  helpful.  We discussed how we cannot provide couples counseling as she is receiving individual counseling, however, there are other options.  She stated she knew of 1 therapist that does couples counseling and plans to follow through with making an appointment.  Some ways to communicate in this relationship toward his going to counseling with her as well as other issues was identified as a need for patient and worked on in session.  Facilitated her identifying needs as well as self supportive self talk towards taking steps to cope and care for herself.  She feels that she is less healthy over the last several months due to the stress of the relationship, feels that she is gaining too much weight and wants to get back into her workouts and healthier diet.    Interventions: assessment, CBT, solution focused approaches  Diagnoses:    ICD-10-CM   1. Adjustment disorder with mixed anxiety and depressed mood  F43.23      Plan: Patient is to use CBT, mindfulness and coping skills to help manage decrease symptoms.  Patient to follow through on homework on identifying specific interpersonal needs being met by staying in her current relationship with her boyfriend as she shared how she often identifies his problematic behaviors.     Long-term goal:   Reduce overall level, frequency, and intensity of the feelings of depression, anxiety up to 80% of the time per patient reports.  Reduce anger outbursts and coping evidenced by feeling less anger and "coldness" in her relationships  w/ some family members.  Short-term goal:  To identify and process feelings related to the disappointment of past painful events that increase sad or worthless feelings. Verbally express understanding of the relationship between depressed mood and repression of feelings - such as anger, hurt, and sadness. Improve communication skills and boundary setting w/ her father Improve self insight resulting in identifying and communicating  needs and her marital relationship    Assessment of progress:  progressing    [place]son [place], [facility]

## 2020-10-14 ENCOUNTER — Encounter: Payer: Self-pay | Admitting: Internal Medicine

## 2020-11-03 ENCOUNTER — Ambulatory Visit: Payer: 59 | Admitting: Mental Health

## 2021-05-30 LAB — HM MAMMOGRAPHY

## 2021-08-28 ENCOUNTER — Encounter: Payer: Self-pay | Admitting: Internal Medicine

## 2021-08-31 ENCOUNTER — Ambulatory Visit: Payer: Managed Care, Other (non HMO) | Admitting: Adult Health

## 2021-12-08 ENCOUNTER — Telehealth: Payer: Self-pay

## 2022-01-19 ENCOUNTER — Encounter: Payer: Managed Care, Other (non HMO) | Admitting: Internal Medicine

## 2022-02-16 ENCOUNTER — Encounter: Payer: Managed Care, Other (non HMO) | Admitting: Internal Medicine

## 2022-06-20 ENCOUNTER — Ambulatory Visit (INDEPENDENT_AMBULATORY_CARE_PROVIDER_SITE_OTHER): Payer: Managed Care, Other (non HMO) | Admitting: Internal Medicine

## 2022-06-20 ENCOUNTER — Encounter: Payer: Self-pay | Admitting: Internal Medicine

## 2022-06-20 ENCOUNTER — Other Ambulatory Visit (HOSPITAL_COMMUNITY)
Admission: RE | Admit: 2022-06-20 | Discharge: 2022-06-20 | Disposition: A | Discharge status: Home / Self Care | Payer: Managed Care, Other (non HMO) | Source: Ambulatory Visit | Attending: Internal Medicine | Admitting: Internal Medicine

## 2022-06-20 VITALS — BP 130/78 | HR 85 | Temp 98.0°F | Ht 64.0 in | Wt 176.4 lb

## 2022-06-20 DIAGNOSIS — K2960 Other gastritis without bleeding: Secondary | ICD-10-CM | POA: Insufficient documentation

## 2022-06-20 DIAGNOSIS — Z0000 Encounter for general adult medical examination without abnormal findings: Secondary | ICD-10-CM

## 2022-06-20 DIAGNOSIS — Z1231 Encounter for screening mammogram for malignant neoplasm of breast: Secondary | ICD-10-CM | POA: Diagnosis not present

## 2022-06-20 DIAGNOSIS — N393 Stress incontinence (female) (male): Secondary | ICD-10-CM

## 2022-06-20 DIAGNOSIS — K2900 Acute gastritis without bleeding: Secondary | ICD-10-CM

## 2022-06-20 DIAGNOSIS — Z124 Encounter for screening for malignant neoplasm of cervix: Secondary | ICD-10-CM | POA: Insufficient documentation

## 2022-06-20 DIAGNOSIS — E559 Vitamin D deficiency, unspecified: Secondary | ICD-10-CM | POA: Diagnosis not present

## 2022-06-20 DIAGNOSIS — G44209 Tension-type headache, unspecified, not intractable: Secondary | ICD-10-CM

## 2022-06-20 DIAGNOSIS — E538 Deficiency of other specified B group vitamins: Secondary | ICD-10-CM

## 2022-06-20 DIAGNOSIS — R5383 Other fatigue: Secondary | ICD-10-CM

## 2022-06-20 DIAGNOSIS — T39395A Adverse effect of other nonsteroidal anti-inflammatory drugs [NSAID], initial encounter: Secondary | ICD-10-CM

## 2022-06-20 DIAGNOSIS — E663 Overweight: Secondary | ICD-10-CM

## 2022-06-20 LAB — LIPID PANEL
Cholesterol: 205 mg/dL — ABNORMAL HIGH (ref 0–200)
HDL: 66.2 mg/dL (ref >39.00)
LDL Cholesterol: 111 mg/dL — ABNORMAL HIGH (ref 0–99)
NonHDL: 138.66
Total CHOL/HDL Ratio: 3
Triglycerides: 136 mg/dL (ref 0.0–149.0)
VLDL: 27.2 mg/dL (ref 0.0–40.0)

## 2022-06-20 LAB — LDL CHOLESTEROL, DIRECT: Direct LDL: 125 mg/dL

## 2022-06-20 LAB — COMPREHENSIVE METABOLIC PANEL
ALT: 9 U/L (ref 0–35)
AST: 14 U/L (ref 0–37)
Albumin: 4.3 g/dL (ref 3.5–5.2)
Alkaline Phosphatase: 44 U/L (ref 39–117)
BUN: 11 mg/dL (ref 6–23)
CO2: 28 mEq/L (ref 19–32)
Calcium: 9 mg/dL (ref 8.4–10.5)
Chloride: 101 mEq/L (ref 96–112)
Creatinine, Ser: 0.71 mg/dL (ref 0.40–1.20)
GFR: 98.68 mL/min (ref >60.00)
Glucose, Bld: 87 mg/dL (ref 70–99)
Potassium: 3.8 mEq/L (ref 3.5–5.1)
Sodium: 138 mEq/L (ref 135–145)
Total Bilirubin: 0.6 mg/dL (ref 0.2–1.2)
Total Protein: 6.9 g/dL (ref 6.0–8.3)

## 2022-06-20 LAB — VITAMIN D 25 HYDROXY (VIT D DEFICIENCY, FRACTURES): VITD: 22.51 ng/mL — ABNORMAL LOW (ref 30.00–100.00)

## 2022-06-20 LAB — TSH: TSH: 1.37 u[IU]/mL (ref 0.35–5.50)

## 2022-06-20 LAB — H. PYLORI ANTIBODY, IGG: H Pylori IgG: NEGATIVE

## 2022-06-20 LAB — VITAMIN B12: Vitamin B-12: 273 pg/mL (ref 211–911)

## 2022-06-20 MED ORDER — PANTOPRAZOLE SODIUM 40 MG PO TBEC: 40.0000 mg | DELAYED_RELEASE_TABLET | Freq: Every day | ORAL | 3 refills | Status: DC

## 2022-06-20 NOTE — Assessment & Plan Note (Addendum)
Chronic, advised to resume supplementation at 2500 mcg daily . Intrinsic factor ab pending   Lab Results  Component Value Date   VITAMINB12 304 08/12/2020

## 2022-06-20 NOTE — Assessment & Plan Note (Signed)
Ruling out UTI.  No cystocele. Advised to practice Kegel exercises.

## 2022-06-20 NOTE — Assessment & Plan Note (Signed)
Neurologic exam is normal and frequency is no alarming/no red flags

## 2022-06-20 NOTE — Patient Instructions (Addendum)
Treating you for gastritis :    Pantoprazole once daily in the AM for 30 days (30 mintues before food or coffee)   Take Famotidine 20 mg before dinner  Abstain from ETOH , excedrin and ibuprofen for 2 weeks .  No other aspirin or aleve either.   You can  takeup to 2000 mg of acetominophen (tylenol) every day safely  In divided doses (500 mg every 6 hours  Or 1000 mg every 12 hours   Notify me in 2 weeks   Kegel exercises for the stress incontinence

## 2022-06-20 NOTE — Assessment & Plan Note (Signed)

## 2022-06-20 NOTE — Progress Notes (Signed)
Patient ID: Erin Boone, female    DOB: 03/14/1971  Age: 52 y.o. MRN: [id number]  The patient is here for annual preventive examination and management of other chronic and acute problems.   The risk factors are reflected in the social history.   The roster of all physicians providing medical care to patient - is listed in the Snapshot section of the chart.   Activities of daily living:  The patient is 100% independent in all ADLs: dressing, toileting, feeding as well as independent mobility   Home safety : The patient has smoke detectors in the home. They wear seatbelts.  There are no unsecured firearms at home. There is no violence in the home.    There is no risks for hepatitis, STDs or HIV. There is no   history of blood transfusion. They have no travel history to infectious disease endemic areas of the world.   The patient has seen their dentist in the last six month. They have seen their eye doctor in the last year. The patinet  denies slight hearing difficulty with regard to whispered voices and some television programs.  They have deferred audiologic testing in the last year.  They do not  have excessive sun exposure. Discussed the need for sun protection: hats, long sleeves and use of sunscreen if there is significant sun exposure.    Diet: the importance of a healthy diet is discussed. They do have a healthy diet.   The benefits of regular aerobic exercise were discussed. The patient  exercises  3 to 5 days per week  for  60 minutes.    Depression screen: there are no signs or vegative symptoms of depression- irritability, change in appetite, anhedonia, sadness/tearfullness.   The following portions of the patient's history were reviewed and updated as appropriate: allergies, current medications, past family history, past medical history,  past surgical history, past social history  and problem list.   Visual acuity was not assessed per patient preference since the patient has regular  follow up with an  ophthalmologist. Hearing and body mass index were assessed and reviewed.    During the course of the visit the patient was educated and counseled about appropriate screening and preventive services including : fall prevention , diabetes screening, nutrition counseling, colorectal cancer screening, and recommended immunizations.    Chief Complaint:  Social:  Has relocated to [place], working from home.  Living with boyfriend in family's 19 yr old home.  Father , 55,  has Parkinson's living alone at [place] for people > , father has had some financial gaffs.    increased stress manifesting as increased frequency of headaches, occurring as "pretty bad"  and occurring about every 7 to 10 days. Sometimes frontal unilateral,  sometimes occipital  location.    Chronic heartburn managed with tums and pepcid.  However since [place] has been having gastritis.  INCREASED ALCOHOL OVER THE holiday,  has also had ibuprofen and Excedrin.  Gastritis pain described as stabbing , epigastric/perimubilical      Review of Symptoms  Patient denies headache, fevers, malaise, unintentional weight loss, skin rash, eye pain, sinus congestion and sinus pain, sore throat, dysphagia,  hemoptysis , cough, dyspnea, wheezing, chest pain, palpitations, orthopnea, edema,, nausea, melena, diarrhea, constipation, flank pain, dysuria, hematuria, urinary  Frequency, nocturia, numbness, tingling, seizures,  Focal weakness, Loss of consciousness,  Tremor, insomnia, depression, anxiety, and suicidal ideation.    Physical Exam:  BP 130/78   Pulse 85  Temp 98 F (36.7 C) (Oral)   Ht '5\' 4"'$  (1.626 m)   Wt 176 lb 6.4 oz (80 kg)   SpO2 98%   BMI 30.28 kg/m    Physical Exam Vitals reviewed.  Constitutional:      General: She is not in acute distress.    Appearance: Normal appearance. She is well-developed and normal weight. She is not ill-appearing, toxic-appearing or diaphoretic.  HENT:      Head: Normocephalic.     Right Ear: Tympanic membrane, ear canal and external ear normal. There is no impacted cerumen.     Left Ear: Tympanic membrane, ear canal and external ear normal. There is no impacted cerumen.     Nose: Nose normal.     Mouth/Throat:     Mouth: Mucous membranes are moist.     Pharynx: Oropharynx is clear.  Eyes:     General: No scleral icterus.       Right eye: No discharge.        Left eye: No discharge.     Conjunctiva/sclera: Conjunctivae normal.     Pupils: Pupils are equal, round, and reactive to light.  Neck:     Thyroid: No thyromegaly.     Vascular: No carotid bruit or JVD.  Cardiovascular:     Rate and Rhythm: Normal rate and regular rhythm.     Heart sounds: Normal heart sounds.  Pulmonary:     Effort: Pulmonary effort is normal. No respiratory distress.     Breath sounds: Normal breath sounds.  Chest:  Breasts:    Breasts are symmetrical.     Right: Normal. No swelling, inverted nipple, mass, nipple discharge, skin change or tenderness.     Left: Normal. No swelling, inverted nipple, mass, nipple discharge, skin change or tenderness.  Abdominal:     General: Bowel sounds are normal.     Palpations: Abdomen is soft. There is no mass.     Tenderness: There is no abdominal tenderness. There is no guarding or rebound.     Hernia: There is no hernia in the left inguinal area or right inguinal area.  Genitourinary:    Exam position: Lithotomy position.     Pubic Area: No rash or pubic lice.      Labia:        Right: No rash, tenderness, lesion or injury.        Left: No rash, tenderness, lesion or injury.      Vagina: Normal.     Cervix: Normal.     Uterus: Normal.      Adnexa: Right adnexa normal and left adnexa normal.  Musculoskeletal:        General: Normal range of motion.     Cervical back: Normal range of motion and neck supple.  Lymphadenopathy:     Cervical: No cervical adenopathy.     Upper Body:     Right upper body: No  supraclavicular, axillary or pectoral adenopathy.     Left upper body: No supraclavicular, axillary or pectoral adenopathy.     Lower Body: No right inguinal adenopathy. No left inguinal adenopathy.  Skin:    General: Skin is warm and dry.  Neurological:     General: No focal deficit present.     Mental Status: She is alert and oriented to person, place, and time. Mental status is at baseline.  Psychiatric:        Mood and Affect: Mood normal.        Behavior: Behavior  normal.        Thought Content: Thought content normal.        Judgment: Judgment normal.     Assessment and Plan: Acute gastritis without hemorrhage, unspecified gastritis type -     H. pylori antibody, IgG  Cervical cancer screening -     Cytology - PAP  Encounter for screening mammogram for malignant neoplasm of breast -     3D Screening Mammogram, Left and Right; Future  B12 deficiency Assessment & Plan:  Chronic, advised to resume supplementation at 2500 mcg daily . Intrinsic factor ab pending   Lab Results  Component Value Date   [id number] 366 08/12/2020     Orders: -     Vitamin B12 -     Intrinsic Factor Antibodies  Vitamin D deficiency -     VITAMIN D 25 Hydroxy (Vit-D Deficiency, Fractures)  Encounter for preventative adult health care examination  Overweight (BMI 25.0-29.9) -     Lipid panel -     LDL cholesterol, direct -     Comprehensive metabolic panel -     TSH -     Cytology - PAP -     3D Screening Mammogram, Left and Right; Future  Other fatigue  Stress incontinence in female Assessment & Plan: Ruling out UTI.  No cystocele. Advised to practice Kegel exercises.   Orders: -     Urinalysis, Routine w reflex microscopic -     Urine Culture  Routine general medical examination at a health care facility Assessment & Plan: age appropriate education and counseling updated, referrals for preventative services and immunizations addressed, dietary and smoking counseling  addressed, most recent labs reviewed.  I have personally reviewed and have noted:   1) the patient's medical and social history 2) The pt's use of alcohol, tobacco, and illicit drugs 3) The patient's current medications and supplements 4) Functional ability including ADL's, fall risk, home safety risk, hearing and visual impairment 5) Diet and physical activities 6) Evidence for depression or mood disorder 7) The patient's height, weight, and BMI have been recorded in the chart  I have made referrals, and provided counseling and education based on review of the above    Headache, emotional tension Assessment & Plan: Neurologic exam is normal and frequency is no alarming/no red flags    Gastritis due to nonsteroidal anti-inflammatory drug (NSAID) Assessment & Plan: Advised to suspend etoh, nsaids , PPI prescribed.  H pylori ab ordered.  Refer to gi if no improvement in 4 weeks    Other orders -     Pantoprazole Sodium; Take 1 tablet (40 mg total) by mouth daily. In the morning 30 minutes  before eating  Dispense: 30 tablet; Refill: 3    No follow-ups on file.  Crecencio Mc, MD

## 2022-06-20 NOTE — Assessment & Plan Note (Addendum)
Advised to suspend etoh, nsaids , PPI prescribed.  H pylori ab ordered.  Refer to gi if no improvement in 4 weeks

## 2022-06-21 LAB — URINE CULTURE
MICRO NUMBER:: 14382827
Result:: NO GROWTH
SPECIMEN QUALITY:: ADEQUATE

## 2022-06-21 LAB — URINALYSIS, ROUTINE W REFLEX MICROSCOPIC
Bilirubin Urine: NEGATIVE
Hgb urine dipstick: NEGATIVE
Ketones, ur: NEGATIVE
Leukocytes,Ua: NEGATIVE
Nitrite: NEGATIVE
Specific Gravity, Urine: 1.01 (ref 1.000–1.030)
Total Protein, Urine: NEGATIVE
Urine Glucose: NEGATIVE
Urobilinogen, UA: 0.2 (ref 0.0–1.0)
pH: 7 (ref 5.0–8.0)

## 2022-06-21 NOTE — Addendum Note (Signed)
Addended by: Neta Ehlers on: 06/21/2022 09:33 AM   Modules accepted: Orders

## 2022-06-22 LAB — CYTOLOGY - PAP
Adequacy: ABSENT
Chlamydia: NEGATIVE
Comment: NEGATIVE
Comment: NEGATIVE
Comment: NEGATIVE
Comment: NORMAL
Diagnosis: NEGATIVE
High risk HPV: NEGATIVE
Neisseria Gonorrhea: NEGATIVE
Trichomonas: NEGATIVE

## 2022-06-22 LAB — URINE CULTURE
MICRO NUMBER:: 14388462
Result:: NO GROWTH
SPECIMEN QUALITY:: ADEQUATE

## 2022-09-10 ENCOUNTER — Other Ambulatory Visit (HOSPITAL_BASED_OUTPATIENT_CLINIC_OR_DEPARTMENT_OTHER): Payer: Self-pay | Admitting: Internal Medicine

## 2022-09-10 DIAGNOSIS — Z1231 Encounter for screening mammogram for malignant neoplasm of breast: Secondary | ICD-10-CM

## 2022-09-11 ENCOUNTER — Encounter (HOSPITAL_BASED_OUTPATIENT_CLINIC_OR_DEPARTMENT_OTHER): Payer: Self-pay

## 2022-09-11 ENCOUNTER — Ambulatory Visit (HOSPITAL_BASED_OUTPATIENT_CLINIC_OR_DEPARTMENT_OTHER)
Admission: RE | Admit: 2022-09-11 | Discharge: 2022-09-11 | Disposition: A | Discharge status: Home / Self Care | Payer: Managed Care, Other (non HMO) | Source: Ambulatory Visit

## 2022-09-11 DIAGNOSIS — Z1231 Encounter for screening mammogram for malignant neoplasm of breast: Secondary | ICD-10-CM | POA: Insufficient documentation

## 2022-11-26 ENCOUNTER — Other Ambulatory Visit: Payer: Self-pay | Admitting: Internal Medicine

## 2022-11-26 ENCOUNTER — Encounter: Payer: Self-pay | Admitting: Internal Medicine

## 2022-11-27 NOTE — Telephone Encounter (Signed)
Historical medication  Last OV: 06/20/2022 Next OV: not scheduled

## 2022-11-28 ENCOUNTER — Other Ambulatory Visit: Payer: Self-pay | Admitting: Family

## 2022-11-28 MED ORDER — LEVONORGEST-ETH ESTRAD 91-DAY 0.1-0.02 & 0.01 MG PO TABS: 1.0000 | ORAL_TABLET | Freq: Every day | ORAL | 2 refills | Status: DC

## 2022-11-28 NOTE — Telephone Encounter (Signed)
Patient listed the medication she is currently taking she will need a refill . She is leaving to go out of town on Friday . Her provider will not be back until Monday.

## 2022-11-28 NOTE — Telephone Encounter (Signed)
Duplicate request

## 2022-11-28 NOTE — Telephone Encounter (Signed)
Pt called in stating that in order for her GYN to refill her birth control med they need her recent pap smear visit fax over to that doctor. Pt states she need her med before Friday.

## 2023-01-07 ENCOUNTER — Ambulatory Visit: Payer: Managed Care, Other (non HMO) | Admitting: Family Medicine

## 2023-01-07 VITALS — BP 124/86 | HR 82 | Temp 97.9°F | Ht 64.0 in | Wt 185.0 lb

## 2023-01-07 DIAGNOSIS — Z23 Encounter for immunization: Secondary | ICD-10-CM | POA: Diagnosis not present

## 2023-01-07 DIAGNOSIS — R42 Dizziness and giddiness: Secondary | ICD-10-CM

## 2023-01-07 MED ORDER — MECLIZINE HCL 12.5 MG PO TABS: 12.5000 mg | ORAL_TABLET | Freq: Three times a day (TID) | ORAL | 0 refills | Status: DC | PRN

## 2023-01-07 NOTE — Patient Instructions (Signed)
It was a pleasure meeting you today. Thank you for allowing me to take part in your health care.  Our goals for today as we discussed include:  We will get some labs today.  If they are abnormal or we need to do something about them, I will call you.  If they are normal, I will send you a message on MyChart (if it is active) or a letter in the mail.  If you don't hear from [place] in 2 weeks, please call the office at the number below.   Received Tetanus booster today.  Due every 10 years  Recommend Shingles vaccine.  This is a 2 dose series and can be given at your local pharmacy.  Please talk to your pharmacist about this.     If you have any questions or concerns, please do not hesitate to call the office at [phone].  I look forward to our next visit and until then take care and stay safe.  Regards,   Dana Allan, MD   [facility]

## 2023-01-07 NOTE — Progress Notes (Signed)
SUBJECTIVE:   Chief Complaint  Patient presents with   Dizziness    Pt states sore throat is gone, dizzy spells, and headaches. pt states she has some weight gain and thinks it may with that but not sure. Also mentions she's been having cough fit here and there    HPI Presents to clinic for acute visit  Reports was on cruise 2 weeks ago.  Since returning home has noticed intermittent dizziness.  Developed sore throat 8 days ago that resolved 2 days ago.  Endorses intermittent dry cough and mild headache.  No conservative treatment tried.  Denies any chest pain, heart palpitations, shortness of breath, fevers, tinnitus, ear pain, discharge, rhinorrhea, difficulty swallowing, gait disturbance, nausea, vomiting, diarrhea, urinary symptoms, hematuria, bloody stool.  Endorses no decrease in appetite and has been hydrating well.  Denies tobacco use.  Endorses 1-3 glasses wine daily.  Has been sitting at pool periodically.    Denies any recent sick contacts.   PERTINENT PMH / PSH: H/O gastritis due to NSAIDS   OBJECTIVE:  BP 124/86 (BP Location: Left Arm, Patient Position: Sitting, Cuff Size: Normal)   Pulse 82   Temp 97.9 F (36.6 C) (Oral)   Ht 5\' 4"  (1.626 m)   Wt 185 lb (83.9 kg)   SpO2 95%   BMI 31.76 kg/m    Physical Exam Vitals reviewed.  Constitutional:      General: She is not in acute distress.    Appearance: Normal appearance. She is normal weight. She is not ill-appearing, toxic-appearing or diaphoretic.  HENT:     Right Ear: Tympanic membrane, ear canal and external ear normal.     Left Ear: Tympanic membrane, ear canal and external ear normal.  Eyes:     General:        Right eye: No discharge.        Left eye: No discharge.     Conjunctiva/sclera: Conjunctivae normal.  Neck:     Thyroid: No thyromegaly or thyroid tenderness.  Cardiovascular:     Rate and Rhythm: Normal rate and regular rhythm.     Heart sounds: Normal heart sounds.  Pulmonary:     Effort:  Pulmonary effort is normal.     Breath sounds: Normal breath sounds.  Abdominal:     General: Bowel sounds are normal.  Musculoskeletal:        General: Normal range of motion.  Skin:    General: Skin is warm and dry.  Neurological:     General: No focal deficit present.     Mental Status: She is alert and oriented to person, place, and time. Mental status is at baseline.  Psychiatric:        Mood and Affect: Mood normal.        Behavior: Behavior normal.        Thought Content: Thought content normal.        Judgment: Judgment normal.        01/07/2023    3:36 PM 06/20/2022    9:17 AM 08/12/2020    9:14 AM 07/04/2018    3:35 PM  Depression screen PHQ 2/9  Decreased Interest 0 1 0 0  Down, Depressed, Hopeless 0 0 0 0  PHQ - 2 Score 0 1 0 0  Altered sleeping    0  Tired, decreased energy    0  Change in appetite    0  Feeling bad or failure about yourself     0  Trouble  concentrating    0  Moving slowly or fidgety/restless    0  Suicidal thoughts    0  PHQ-9 Score    0  Difficult doing work/chores    Not difficult at all      01/07/2023    3:37 PM  GAD 7 : Generalized Anxiety Score  Nervous, Anxious, on Edge 1  Control/stop worrying 0  Worry too much - different things 0  Trouble relaxing 0  Restless 0  Easily annoyed or irritable 1  Afraid - awful might happen 0  Total GAD 7 Score 2  Anxiety Difficulty Not difficult at all    ASSESSMENT/PLAN:  Dizziness Assessment & Plan: Not currently having symptoms.  Benign neuro exam today.  No other abnormal finding on exam today. No signs of dehydration today.  Possible viral etiology that appears to be resolving or mild dehydration from vacation, excess EtoH and little H2O at that time, also spending time by pool recently in pool with dehydrating drinks.  Check Cmet, CBC Recommend decrease in EtoH Recommend increase in H2O Trial Meclizine 12.5 mg TID prn, if no improvement follow up with PCP  Orders: -     CBC with  Differential/Platelet -     Comprehensive metabolic panel -     Meclizine HCl; Take 1 tablet (12.5 mg total) by mouth 3 (three) times daily as needed for dizziness.  Dispense: 30 tablet; Refill: 0  Need for vaccination -     Tdap vaccine greater than or equal to 7yo IM   PDMP reviewed  Return if symptoms worsen or fail to improve, for PCP.  Dana Allan, MD

## 2023-01-08 ENCOUNTER — Encounter: Payer: Self-pay | Admitting: Internal Medicine

## 2023-01-08 ENCOUNTER — Encounter: Payer: Self-pay | Admitting: Family Medicine

## 2023-01-08 LAB — CBC WITH DIFFERENTIAL/PLATELET
Basophils Absolute: 0 10*3/uL (ref 0.0–0.1)
Basophils Relative: 0.5 % (ref 0.0–3.0)
Eosinophils Absolute: 0.2 10*3/uL (ref 0.0–0.7)
Eosinophils Relative: 2.1 % (ref 0.0–5.0)
HCT: 41.9 % (ref 36.0–46.0)
Hemoglobin: 13.7 g/dL (ref 12.0–15.0)
Lymphocytes Relative: 27.8 % (ref 12.0–46.0)
Lymphs Abs: 2.2 10*3/uL (ref 0.7–4.0)
MCHC: 32.6 g/dL (ref 30.0–36.0)
MCV: 101.3 fl — ABNORMAL HIGH (ref 78.0–100.0)
Monocytes Absolute: 0.8 10*3/uL (ref 0.1–1.0)
Monocytes Relative: 9.3 % (ref 3.0–12.0)
Neutro Abs: 4.9 10*3/uL (ref 1.4–7.7)
Neutrophils Relative %: 60.3 % (ref 43.0–77.0)
Platelets: 282 10*3/uL (ref 150.0–400.0)
RBC: 4.14 Mil/uL (ref 3.87–5.11)
RDW: 12.8 % (ref 11.5–15.5)
WBC: 8.1 10*3/uL (ref 4.0–10.5)

## 2023-01-08 LAB — COMPREHENSIVE METABOLIC PANEL
ALT: 10 U/L (ref 0–35)
AST: 16 U/L (ref 0–37)
Albumin: 4.3 g/dL (ref 3.5–5.2)
Alkaline Phosphatase: 50 U/L (ref 39–117)
BUN: 9 mg/dL (ref 6–23)
CO2: 26 mEq/L (ref 19–32)
Calcium: 9.4 mg/dL (ref 8.4–10.5)
Chloride: 101 mEq/L (ref 96–112)
Creatinine, Ser: 0.7 mg/dL (ref 0.40–1.20)
GFR: 99.98 mL/min (ref >60.00)
Glucose, Bld: 86 mg/dL (ref 70–99)
Potassium: 4.1 mEq/L (ref 3.5–5.1)
Sodium: 136 mEq/L (ref 135–145)
Total Bilirubin: 0.4 mg/dL (ref 0.2–1.2)
Total Protein: 7.4 g/dL (ref 6.0–8.3)

## 2023-01-10 ENCOUNTER — Encounter: Payer: Self-pay | Admitting: Internal Medicine

## 2023-01-10 ENCOUNTER — Other Ambulatory Visit: Payer: Self-pay | Admitting: Internal Medicine

## 2023-01-10 DIAGNOSIS — D7589 Other specified diseases of blood and blood-forming organs: Secondary | ICD-10-CM
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
## 2023-01-10 NOTE — Telephone Encounter (Signed)
Pt has seen and read the [employer].

## 2023-01-20 ENCOUNTER — Encounter: Payer: Self-pay | Admitting: Family Medicine

## 2023-01-20 DIAGNOSIS — R42 Dizziness and giddiness: Secondary | ICD-10-CM | POA: Insufficient documentation

## 2023-01-20 DIAGNOSIS — Z23 Encounter for immunization: Secondary | ICD-10-CM | POA: Insufficient documentation

## 2023-01-20 NOTE — Assessment & Plan Note (Addendum)
Not currently having symptoms.  Benign neuro exam today.  No other abnormal finding on exam today. No signs of dehydration today.  Possible viral etiology that appears to be resolving or mild dehydration from vacation, excess EtoH and little H2O at that time, also spending time by pool recently in pool with dehydrating drinks.  Check Cmet, CBC Recommend decrease in [facility] Recommend increase in H2O Trial Meclizine 12.5 mg TID prn, if no improvement follow up with PCP

## 2023-01-31 ENCOUNTER — Encounter (INDEPENDENT_AMBULATORY_CARE_PROVIDER_SITE_OTHER): Payer: Self-pay

## 2023-02-27 ENCOUNTER — Ambulatory Visit: Payer: Managed Care, Other (non HMO) | Admitting: Internal Medicine

## 2023-02-27 ENCOUNTER — Encounter: Payer: Self-pay | Admitting: Internal Medicine

## 2023-02-27 VITALS — BP 104/70 | HR 96 | Temp 98.8°F | Ht 64.0 in | Wt 187.8 lb

## 2023-02-27 DIAGNOSIS — Z6832 Body mass index (BMI) 32.0-32.9, adult: Secondary | ICD-10-CM

## 2023-02-27 DIAGNOSIS — D7589 Other specified diseases of blood and blood-forming organs: Secondary | ICD-10-CM | POA: Insufficient documentation

## 2023-02-27 DIAGNOSIS — E669 Obesity, unspecified: Secondary | ICD-10-CM

## 2023-02-27 DIAGNOSIS — E559 Vitamin D deficiency, unspecified: Secondary | ICD-10-CM | POA: Diagnosis not present

## 2023-02-27 DIAGNOSIS — H8110 Benign paroxysmal vertigo, unspecified ear: Secondary | ICD-10-CM

## 2023-02-27 DIAGNOSIS — E538 Deficiency of other specified B group vitamins: Secondary | ICD-10-CM | POA: Diagnosis not present

## 2023-02-27 DIAGNOSIS — T39395A Adverse effect of other nonsteroidal anti-inflammatory drugs [NSAID], initial encounter: Secondary | ICD-10-CM

## 2023-02-27 DIAGNOSIS — K2960 Other gastritis without bleeding: Secondary | ICD-10-CM

## 2023-02-27 LAB — TSH: TSH: 0.98 u[IU]/mL (ref 0.35–5.50)

## 2023-02-27 LAB — B12 AND FOLATE PANEL
Folate: 15.7 ng/mL (ref >5.9)
Vitamin B-12: 1127 pg/mL — ABNORMAL HIGH (ref 211–911)

## 2023-02-27 LAB — VITAMIN D 25 HYDROXY (VIT D DEFICIENCY, FRACTURES): VITD: 26.48 ng/mL — ABNORMAL LOW (ref 30.00–100.00)

## 2023-02-27 MED ORDER — NALTREXONE-BUPROPION HCL ER 8-90 MG PO TB12
ORAL_TABLET | ORAL | 0 refills | Status: AC
Start: 2023-02-27 — End: ?

## 2023-02-27 NOTE — Assessment & Plan Note (Signed)
Reviewed with patient today.  Reassured that this was not due to famotidine use.

## 2023-02-27 NOTE — Assessment & Plan Note (Signed)
Symptoms resoled with  PPI and change in diet

## 2023-02-27 NOTE — Assessment & Plan Note (Signed)
Borderline low b12 and excessive alcohol intake over the summer may be contributing.  Normal screening for thyroid, b12/folate again.   Copper level is pending   Lab Results  Component Value Date   TSH 0.98 02/27/2023   Last vitamin B12 and Folate Lab Results  Component Value Date   VITAMINB12 1,127 (H) 02/27/2023   FOLATE 15.7 02/27/2023

## 2023-02-27 NOTE — Progress Notes (Unsigned)
Subjective:  Patient ID: Erin Boone, female    DOB: 05-02-1971  Age: 52 y.o. MRN: [id number]  CC: The primary encounter diagnosis was Macrocytosis without anemia. Diagnoses of B12 deficiency, Class 1 obesity without serious comorbidity with body mass index (BMI) of 32.0 to 32.9 in adult, unspecified obesity type, Vitamin D deficiency, Obesity (BMI 30.0-34.9), Benign paroxysmal positional vertigo, unspecified laterality, and Gastritis due to nonsteroidal anti-inflammatory drug (NSAID) were also pertinent to this visit.   HPI Tallula D Rosch presents for  Chief Complaint  Patient presents with   Medical Management of Chronic Issues    Treated  /evaluated on July 22 by Petra Kuba for dizziness that started after she returned from a vacation cruise.  Dx'd wth viral URI but Noted to have a macrocytosis without anemia.  Returns for additional labs.  Alcohol use reviewed.; she reports that she spent the summer overindulging in food and alcohol and has now addressed the subseuqent weight gain with plans to KETO diet.  She has had ss with KETO diet and started it yesterday .  Reviewed her weights over the past 10 years.  Advised her to set a goal BMI of 26 or less and a waist circumference  o f 31 inches   . Also advised her to have lipids checke during time on the diet.  Benign positional vertigo symptoms   she attributed  the vertigo to a s/e of  20  famotidine , no more reflux   since starting  KETO diet.  Has been taking B12 by mouth for the last several weeks    Outpatient Medications Prior to Visit  Medication Sig Dispense Refill   Levonorgestrel-Ethinyl Estradiol (AMETHIA) 0.1-0.02 & 0.01 MG tablet Take 1 tablet by mouth daily. 91 tablet 2   famotidine (PEPCID) 20 MG tablet Take 20 mg by mouth as needed for heartburn or indigestion. (Patient not taking: Reported on 02/27/2023)     Levonorgestrel-Ethinyl Estradiol (AMETHIA) 0.1-0.02 & 0.01 MG tablet Take 1 tablet by mouth daily. (Patient not  taking: Reported on 01/07/2023) 91 tablet 2   meclizine (ANTIVERT) 12.5 MG tablet Take 1 tablet (12.5 mg total) by mouth 3 (three) times daily as needed for dizziness. (Patient not taking: Reported on 02/27/2023) 30 tablet 0   pantoprazole (PROTONIX) 40 MG tablet Take 1 tablet (40 mg total) by mouth daily. In the morning 30 minutes  before eating (Patient not taking: Reported on 01/07/2023) 30 tablet 3   No facility-administered medications prior to visit.    Review of Systems;  Patient denies headache, fevers, malaise, unintentional weight loss, skin rash, eye pain, sinus congestion and sinus pain, sore throat, dysphagia,  hemoptysis , cough, dyspnea, wheezing, chest pain, palpitations, orthopnea, edema, abdominal pain, nausea, melena, diarrhea, constipation, flank pain, dysuria, hematuria, urinary  Frequency, nocturia, numbness, tingling, seizures,  Focal weakness, Loss of consciousness,  Tremor, insomnia, depression, anxiety, and suicidal ideation.      Objective:  BP 104/70   Pulse 96   Temp 98.8 F (37.1 C) (Oral)   Ht 5\' 4"  (1.626 m)   Wt 187 lb 12.8 oz (85.2 kg)   SpO2 94%   BMI 32.24 kg/m   BP Readings from Last 3 Encounters:  02/27/23 104/70  01/07/23 124/86  06/20/22 130/78    Wt Readings from Last 3 Encounters:  02/27/23 187 lb 12.8 oz (85.2 kg)  01/07/23 185 lb (83.9 kg)  06/20/22 176 lb 6.4 oz (80 kg)    Physical Exam Vitals reviewed.  Constitutional:      General: She is not in acute distress.    Appearance: Normal appearance. She is normal weight. She is not ill-appearing, toxic-appearing or diaphoretic.  HENT:     Head: Normocephalic.  Eyes:     General: No scleral icterus.       Right eye: No discharge.        Left eye: No discharge.     Conjunctiva/sclera: Conjunctivae normal.  Cardiovascular:     Rate and Rhythm: Normal rate and regular rhythm.     Heart sounds: Normal heart sounds.  Pulmonary:     Effort: Pulmonary effort is normal. No respiratory  distress.     Breath sounds: Normal breath sounds.  Musculoskeletal:        General: Normal range of motion.  Skin:    General: Skin is warm and dry.  Neurological:     General: No focal deficit present.     Mental Status: She is alert and oriented to person, place, and time. Mental status is at baseline.  Psychiatric:        Mood and Affect: Mood normal.        Behavior: Behavior normal.        Thought Content: Thought content normal.        Judgment: Judgment normal.     No results found for: "HGBA1C"  Lab Results  Component Value Date   CREATININE 0.70 01/07/2023   CREATININE 0.71 06/20/2022   CREATININE 0.78 08/12/2020    Lab Results  Component Value Date   WBC 8.1 01/07/2023   HGB 13.7 01/07/2023   HCT 41.9 01/07/2023   PLT 282.0 01/07/2023   GLUCOSE 86 01/07/2023   CHOL 205 (H) 06/20/2022   TRIG 136.0 06/20/2022   HDL 66.20 06/20/2022   LDLDIRECT 125.0 06/20/2022   LDLCALC 111 (H) 06/20/2022   ALT 10 01/07/2023   AST 16 01/07/2023   NA 136 01/07/2023   K 4.1 01/07/2023   CL 101 01/07/2023   CREATININE 0.70 01/07/2023   BUN 9 01/07/2023   CO2 26 01/07/2023   TSH 0.98 02/27/2023    MM 3D SCREENING MAMMOGRAM BILATERAL BREAST  Result Date: 09/12/2022 CLINICAL DATA:  Screening. EXAM: DIGITAL SCREENING BILATERAL MAMMOGRAM WITH TOMOSYNTHESIS AND CAD TECHNIQUE: Bilateral screening digital craniocaudal and mediolateral oblique mammograms were obtained. Bilateral screening digital breast tomosynthesis was performed. The images were evaluated with computer-aided detection. COMPARISON:  Previous exam(s). ACR Breast Density Category c: The breasts are heterogeneously dense, which may obscure small masses. FINDINGS: There are no findings suspicious for malignancy. IMPRESSION: No mammographic evidence of malignancy. A result letter of this screening mammogram will be mailed directly to the patient. RECOMMENDATION: Screening mammogram in one year. (Code:SM-B-01Y) BI-RADS  CATEGORY  1: Negative. Electronically Signed   By: Elberta Fortis M.D.   On: 09/12/2022 09:48    Assessment & Plan:  .Macrocytosis without anemia Assessment & Plan: Borderline low b12 and excessive alcohol intake over the summer may be contributing.  Normal screening for thyroid, b12/folate again.   Copper level is pending   Lab Results  Component Value Date   TSH 0.98 02/27/2023   Last vitamin B12 and Folate Lab Results  Component Value Date   VITAMINB12 1,127 (H) 02/27/2023   FOLATE 15.7 02/27/2023     Orders: -     B12 and Folate Panel -     Reticulocytes; Future -     TSH -     Copper, serum  B12  deficiency -     Intrinsic Factor Antibodies  Class 1 obesity without serious comorbidity with body mass index (BMI) of 32.0 to 32.9 in adult, unspecified obesity type -     Naltrexone-buPROPion HCl ER; One tablet every morning for one week, then twice daily for one week.. Increase gradually to 2 tablets twice daily  Dispense: 120 tablet; Refill: 0  Vitamin D deficiency -     VITAMIN D 25 Hydroxy (Vit-D Deficiency, Fractures)  Obesity (BMI 30.0-34.9) Assessment & Plan: Reviewed her previous success at weight loss,  Her goal weight for BMI < 26  is 155 lbs  I have addressed  BMI and recommended wt loss of 10% of body weigh over the next 6 months using a low glycemic index diet and regular exercise a minimum of 5 days per week.     Benign paroxysmal positional vertigo, unspecified laterality Assessment & Plan: Reviewed with patient today.  Reassured that this was not due to famotidine use.    Gastritis due to nonsteroidal anti-inflammatory drug (NSAID) Assessment & Plan: Symptoms resoled with  PPI and change in diet    Other orders -     Extra Specimen     I provided 32 minutes of face-to-face time during this encounter reviewing patient's last visit with me, recent surgical and non surgical procedures, previous  labs and imaging studies, counseling on currently  addressed issues,  and post visit ordering to diagnostics and therapeutics .   Follow-up: No follow-ups on file.   Sherlene Shams, MD

## 2023-02-27 NOTE — Assessment & Plan Note (Signed)
Reviewed her previous success at weight loss,  Her goal weight for BMI < 26  is 155 lbs  I have addressed  BMI and recommended wt loss of 10% of body weigh over the next 6 months using a low glycemic index diet and regular exercise a minimum of 5 days per week.

## 2023-02-27 NOTE — Patient Instructions (Addendum)
Studies have shown that for women,  a goal of BMI 26 or less and a waist circumference of 31 inches or less lowers your risk for heart disease,  diabetes, and stroke  Your Goal weight /BMI is 155  lbs   for a BMI of 26   Return during your KETO diet for lipid    Contrave is ot covered by your insurance but I have sent an rx to your pharmacy anyway

## 2023-03-04 LAB — EXTRA SPECIMEN

## 2023-03-04 LAB — INTRINSIC FACTOR ANTIBODIES: Intrinsic Factor: NEGATIVE

## 2023-03-04 LAB — COPPER, SERUM

## 2023-04-25 ENCOUNTER — Telehealth: Payer: Self-pay | Admitting: Internal Medicine

## 2023-04-25 DIAGNOSIS — E782 Mixed hyperlipidemia: Secondary | ICD-10-CM

## 2023-04-25 DIAGNOSIS — D7589 Other specified diseases of blood and blood-forming organs: Secondary | ICD-10-CM

## 2023-04-25 NOTE — Telephone Encounter (Signed)
Patient need lab orders.

## 2023-04-30 ENCOUNTER — Other Ambulatory Visit: Payer: Managed Care, Other (non HMO)

## 2023-07-16 DIAGNOSIS — D225 Melanocytic nevi of trunk: Secondary | ICD-10-CM | POA: Diagnosis not present

## 2023-07-16 DIAGNOSIS — L814 Other melanin hyperpigmentation: Secondary | ICD-10-CM | POA: Diagnosis not present

## 2023-07-16 DIAGNOSIS — D485 Neoplasm of uncertain behavior of skin: Secondary | ICD-10-CM | POA: Diagnosis not present

## 2023-07-16 DIAGNOSIS — L821 Other seborrheic keratosis: Secondary | ICD-10-CM | POA: Diagnosis not present

## 2023-09-06 DIAGNOSIS — H52223 Regular astigmatism, bilateral: Secondary | ICD-10-CM | POA: Diagnosis not present

## 2023-09-06 DIAGNOSIS — H04123 Dry eye syndrome of bilateral lacrimal glands: Secondary | ICD-10-CM | POA: Diagnosis not present

## 2023-09-06 DIAGNOSIS — H2513 Age-related nuclear cataract, bilateral: Secondary | ICD-10-CM | POA: Diagnosis not present

## 2023-11-29 ENCOUNTER — Other Ambulatory Visit: Payer: Self-pay | Admitting: Internal Medicine

## 2024-01-14 DIAGNOSIS — D225 Melanocytic nevi of trunk: Secondary | ICD-10-CM | POA: Diagnosis not present

## 2024-01-14 DIAGNOSIS — L739 Follicular disorder, unspecified: Secondary | ICD-10-CM | POA: Diagnosis not present

## 2024-01-14 DIAGNOSIS — L814 Other melanin hyperpigmentation: Secondary | ICD-10-CM | POA: Diagnosis not present

## 2024-01-14 DIAGNOSIS — L821 Other seborrheic keratosis: Secondary | ICD-10-CM | POA: Diagnosis not present

## 2024-02-07 ENCOUNTER — Other Ambulatory Visit (HOSPITAL_COMMUNITY)
Admission: RE | Admit: 2024-02-07 | Discharge: 2024-02-07 | Disposition: A | Discharge status: Home / Self Care | Source: Ambulatory Visit | Attending: Internal Medicine | Admitting: Internal Medicine

## 2024-02-07 ENCOUNTER — Encounter: Payer: Self-pay | Admitting: Internal Medicine

## 2024-02-07 ENCOUNTER — Ambulatory Visit (INDEPENDENT_AMBULATORY_CARE_PROVIDER_SITE_OTHER): Admitting: Internal Medicine

## 2024-02-07 VITALS — BP 118/72 | HR 84 | Temp 98.1°F | Ht 64.0 in | Wt 174.6 lb

## 2024-02-07 DIAGNOSIS — R7301 Impaired fasting glucose: Secondary | ICD-10-CM

## 2024-02-07 DIAGNOSIS — Z124 Encounter for screening for malignant neoplasm of cervix: Secondary | ICD-10-CM | POA: Insufficient documentation

## 2024-02-07 DIAGNOSIS — Z1231 Encounter for screening mammogram for malignant neoplasm of breast: Secondary | ICD-10-CM

## 2024-02-07 DIAGNOSIS — Z0001 Encounter for general adult medical examination with abnormal findings: Secondary | ICD-10-CM

## 2024-02-07 DIAGNOSIS — E782 Mixed hyperlipidemia: Secondary | ICD-10-CM

## 2024-02-07 DIAGNOSIS — R5383 Other fatigue: Secondary | ICD-10-CM

## 2024-02-07 DIAGNOSIS — Z0000 Encounter for general adult medical examination without abnormal findings: Secondary | ICD-10-CM

## 2024-02-07 DIAGNOSIS — D7589 Other specified diseases of blood and blood-forming organs: Secondary | ICD-10-CM

## 2024-02-07 LAB — COMPREHENSIVE METABOLIC PANEL WITH GFR
ALT: 10 U/L (ref 0–35)
AST: 14 U/L (ref 0–37)
Albumin: 4.2 g/dL (ref 3.5–5.2)
Alkaline Phosphatase: 47 U/L (ref 39–117)
BUN: 8 mg/dL (ref 6–23)
CO2: 26 meq/L (ref 19–32)
Calcium: 9.1 mg/dL (ref 8.4–10.5)
Chloride: 101 meq/L (ref 96–112)
Creatinine, Ser: 0.7 mg/dL (ref 0.40–1.20)
GFR: 99.23 mL/min (ref >60.00)
Glucose, Bld: 87 mg/dL (ref 70–99)
Potassium: 4.5 meq/L (ref 3.5–5.1)
Sodium: 137 meq/L (ref 135–145)
Total Bilirubin: 0.5 mg/dL (ref 0.2–1.2)
Total Protein: 6.9 g/dL (ref 6.0–8.3)

## 2024-02-07 LAB — CBC WITH DIFFERENTIAL/PLATELET
Basophils Absolute: 0 K/uL (ref 0.0–0.1)
Basophils Relative: 0.5 % (ref 0.0–3.0)
Eosinophils Absolute: 0.1 K/uL (ref 0.0–0.7)
Eosinophils Relative: 1.5 % (ref 0.0–5.0)
HCT: 40.1 % (ref 36.0–46.0)
Hemoglobin: 13.2 g/dL (ref 12.0–15.0)
Lymphocytes Relative: 22.5 % (ref 12.0–46.0)
Lymphs Abs: 1.4 K/uL (ref 0.7–4.0)
MCHC: 33 g/dL (ref 30.0–36.0)
MCV: 102.1 fl — ABNORMAL HIGH (ref 78.0–100.0)
Monocytes Absolute: 0.5 K/uL (ref 0.1–1.0)
Monocytes Relative: 8 % (ref 3.0–12.0)
Neutro Abs: 4.3 K/uL (ref 1.4–7.7)
Neutrophils Relative %: 67.5 % (ref 43.0–77.0)
Platelets: 276 K/uL (ref 150.0–400.0)
RBC: 3.93 Mil/uL (ref 3.87–5.11)
RDW: 13 % (ref 11.5–15.5)
WBC: 6.4 K/uL (ref 4.0–10.5)

## 2024-02-07 LAB — LIPID PANEL
Cholesterol: 190 mg/dL (ref 0–200)
HDL: 59.2 mg/dL (ref >39.00)
LDL Cholesterol: 113 mg/dL — ABNORMAL HIGH (ref 0–99)
NonHDL: 130.31
Total CHOL/HDL Ratio: 3
Triglycerides: 85 mg/dL (ref 0.0–149.0)
VLDL: 17 mg/dL (ref 0.0–40.0)

## 2024-02-07 LAB — HEMOGLOBIN A1C: Hgb A1c MFr Bld: 5.2 % (ref 4.6–6.5)

## 2024-02-07 LAB — TSH: TSH: 1.49 u[IU]/mL (ref 0.35–5.50)

## 2024-02-07 LAB — LDL CHOLESTEROL, DIRECT: Direct LDL: 124 mg/dL

## 2024-02-07 MED ORDER — LEVONORGEST-ETH ESTRAD 91-DAY 0.1-0.02 & 0.01 MG PO TABS: 1.0000 | ORAL_TABLET | Freq: Every day | ORAL | 3 refills | Status: AC

## 2024-02-07 NOTE — Assessment & Plan Note (Signed)
 age appropriate education and counseling updated, referrals for preventative services and immunizations addressed, dietary and smoking counseling addressed, most recent labs reviewed.  I have personally reviewed and have noted:   1) the patient's medical and social history 2) The pt's use of alcohol, tobacco, and illicit drugs 3) The patient's current medications and supplements 4) Functional ability including ADL's, fall risk, home safety risk, hearing and visual impairment 5) Diet and physical activities 6) Evidence for depression or mood disorder    I have made referrals, and provided counseling and education based on review of the above

## 2024-02-07 NOTE — Patient Instructions (Addendum)
 YOUR MAMMOGRAM Ihas been ordered and your birth control has been refilled for one year

## 2024-02-07 NOTE — Progress Notes (Signed)
 Patient ID: Erin Boone, female    DOB: 1971/05/14  Age: 53 y.o. MRN: [id number]  The patient is here for annual preventive examination and management of other chronic and acute problems.   The risk factors are reflected in the social history.   The roster of all physicians providing medical care to patient - is listed in the Snapshot section of the chart.   Activities of daily living:  The patient is 100% independent in all ADLs: dressing, toileting, feeding as well as independent mobility   Home safety : The patient has smoke detectors in the home. They wear seatbelts.  There are no unsecured firearms at home. There is no violence in the home.    There is no risks for hepatitis, STDs or HIV. There is no   history of blood transfusion. They have no travel history to infectious disease endemic areas of the world.   The patient has seen their dentist in the last six month. They have seen their eye doctor in the last year. The patinet  denies slight hearing difficulty with regard to whispered voices and some television programs.  They have deferred audiologic testing in the last year.  They do not  have excessive sun exposure. Discussed the need for sun protection: hats, long sleeves and use of sunscreen if there is significant sun exposure.    Diet: the importance of a healthy diet is discussed. They do have a healthy diet.   The benefits of regular aerobic exercise were discussed. The patient  exercises  3 to 5 days per week  for  60 minutes.    Depression screen: there are no signs or vegative symptoms of depression- irritability, change in appetite, anhedonia, sadness/tearfullness.   The following portions of the patient's history were reviewed and updated as appropriate: allergies, current medications, past family history, past medical history,  past surgical history, past social history  and problem list.   Visual acuity was not assessed per patient preference since the patient has regular  follow up with an  ophthalmologist. Hearing and body mass index were assessed and reviewed.    During the course of the visit the patient was educated and counseled about appropriate screening and preventive services including : fall prevention , diabetes screening, nutrition counseling, colorectal cancer screening, and recommended immunizations.    Chief Complaint:    None   Review of Symptoms  Patient denies headache, fevers, malaise, unintentional weight loss, skin rash, eye pain, sinus congestion and sinus pain, sore throat, dysphagia,  hemoptysis , cough, dyspnea, wheezing, chest pain, palpitations, orthopnea, edema, abdominal pain, nausea, melena, diarrhea, constipation, flank pain, dysuria, hematuria, urinary  Frequency, nocturia, numbness, tingling, seizures,  Focal weakness, Loss of consciousness,  Tremor, insomnia, depression, anxiety, and suicidal ideation.    Physical Exam:  BP 118/72   Pulse 84   Temp 98.1 F (36.7 C)   Ht 5' 4 (1.626 m)   Wt 174 lb 9.6 oz (79.2 kg)   SpO2 97%   BMI 29.97 kg/m    Physical Exam Vitals reviewed.  Constitutional:      General: She is not in acute distress.    Appearance: Normal appearance. She is well-developed and normal weight. She is not ill-appearing, toxic-appearing or diaphoretic.  HENT:     Head: Normocephalic.     Right Ear: Tympanic membrane, ear canal and external ear normal. There is no impacted cerumen.     Left Ear: Tympanic membrane, ear canal and external ear normal.  There is no impacted cerumen.     Nose: Nose normal.     Mouth/Throat:     Mouth: Mucous membranes are moist.     Pharynx: Oropharynx is clear.  Eyes:     General: No scleral icterus.       Right eye: No discharge.        Left eye: No discharge.     Conjunctiva/sclera: Conjunctivae normal.     Pupils: Pupils are equal, round, and reactive to light.  Neck:     Thyroid : No thyromegaly.     Vascular: No carotid bruit or JVD.  Cardiovascular:     Rate  and Rhythm: Normal rate and regular rhythm.     Heart sounds: Normal heart sounds.  Pulmonary:     Effort: Pulmonary effort is normal. No respiratory distress.     Breath sounds: Normal breath sounds.  Chest:  Breasts:    Breasts are symmetrical.     Right: Normal. No swelling, inverted nipple, mass, nipple discharge, skin change or tenderness.     Left: Normal. No swelling, inverted nipple, mass, nipple discharge, skin change or tenderness.  Abdominal:     General: Bowel sounds are normal.     Palpations: Abdomen is soft. There is no mass.     Tenderness: There is no abdominal tenderness. There is no guarding or rebound.     Hernia: There is no hernia in the left inguinal area or right inguinal area.  Genitourinary:    Exam position: Lithotomy position.     Pubic Area: No rash or pubic lice.      Labia:        Right: No rash, tenderness, lesion or injury.        Left: No rash, tenderness, lesion or injury.      Vagina: Normal.     Cervix: Normal.     Uterus: Normal.      Adnexa: Right adnexa normal and left adnexa normal.  Musculoskeletal:        General: Normal range of motion.     Cervical back: Normal range of motion and neck supple.  Lymphadenopathy:     Cervical: No cervical adenopathy.     Upper Body:     Right upper body: No supraclavicular, axillary or pectoral adenopathy.     Left upper body: No supraclavicular, axillary or pectoral adenopathy.     Lower Body: No right inguinal adenopathy. No left inguinal adenopathy.  Skin:    General: Skin is warm and dry.  Neurological:     General: No focal deficit present.     Mental Status: She is alert and oriented to person, place, and time. Mental status is at baseline.  Psychiatric:        Mood and Affect: Mood normal.        Behavior: Behavior normal.        Thought Content: Thought content normal.        Judgment: Judgment normal.     Assessment and Plan: Encounter for screening mammogram for malignant neoplasm of  breast -     3D Screening Mammogram, Left and Right; Future  Encounter for preventative adult health care examination  Mixed hyperlipidemia -     Lipid panel -     LDL cholesterol, direct  Cervical cancer screening -     Cytology - PAP  Impaired fasting glucose -     Comprehensive metabolic panel with GFR -     Hemoglobin A1c  Other  fatigue -     CBC with Differential/Platelet -     TSH  Macrocytosis without anemia -     Copper , serum -     Reticulocytes  Routine general medical examination at a health care facility Assessment & Plan: age appropriate education and counseling updated, referrals for preventative services and immunizations addressed, dietary and smoking counseling addressed, most recent labs reviewed.  I have personally reviewed and have noted:   1) the patient's medical and social history 2) The pt's use of alcohol, tobacco, and illicit drugs 3) The patient's current medications and supplements 4) Functional ability including ADL's, fall risk, home safety risk, hearing and visual impairment 5) Diet and physical activities 6) Evidence for depression or mood disorder    I have made referrals, and provided counseling and education based on review of the above    Other orders -     Levonorgest-Eth Estrad 91-Day; Take 1 tablet by mouth daily.  Dispense: 91 tablet; Refill: 3    No follow-ups on file.  Verneita LITTIE Kettering, MD

## 2024-02-09 ENCOUNTER — Ambulatory Visit: Payer: Self-pay | Admitting: Internal Medicine

## 2024-02-10 LAB — CYTOLOGY - PAP
Adequacy: ABSENT
Comment: NEGATIVE
Diagnosis: NEGATIVE
High risk HPV: NEGATIVE

## 2024-02-11 ENCOUNTER — Encounter: Payer: Self-pay | Admitting: Internal Medicine

## 2024-02-12 LAB — COPPER, SERUM: Copper: 154 ug/dL (ref 70–175)

## 2024-03-10 ENCOUNTER — Other Ambulatory Visit: Payer: Self-pay | Admitting: Internal Medicine

## 2024-03-10 DIAGNOSIS — Z1231 Encounter for screening mammogram for malignant neoplasm of breast: Secondary | ICD-10-CM

## 2024-03-13 ENCOUNTER — Ambulatory Visit
Admission: RE | Admit: 2024-03-13 | Discharge: 2024-03-13 | Disposition: A | Discharge status: Home / Self Care | Source: Ambulatory Visit

## 2024-03-13 DIAGNOSIS — Z1231 Encounter for screening mammogram for malignant neoplasm of breast: Secondary | ICD-10-CM | POA: Diagnosis not present

## 2024-06-24 ENCOUNTER — Encounter: Payer: Self-pay | Admitting: Internal Medicine

## 2024-06-24 NOTE — Telephone Encounter (Signed)
 Spoke with pt and advised her to contact [facility] and I let her know that they have walk in hours. Pt stated that she will check in to going there and see if they could give her a steroid injection.

## 2024-07-16 ENCOUNTER — Telehealth: Admitting: Nurse Practitioner

## 2024-07-16 DIAGNOSIS — R6889 Other general symptoms and signs: Secondary | ICD-10-CM

## 2024-07-16 DIAGNOSIS — Z20828 Contact with and (suspected) exposure to other viral communicable diseases: Secondary | ICD-10-CM

## 2024-07-16 MED ORDER — OSELTAMIVIR PHOSPHATE 75 MG PO CAPS: 75.0000 mg | ORAL_CAPSULE | Freq: Two times a day (BID) | ORAL | 0 refills | Status: AC

## 2024-07-16 MED ORDER — BENZONATATE 100 MG PO CAPS: 100.0000 mg | ORAL_CAPSULE | Freq: Three times a day (TID) | ORAL | 0 refills | Status: AC | PRN

## 2024-07-16 NOTE — Progress Notes (Signed)
 Work note requested. Sent via [employer]

## 2024-07-16 NOTE — Progress Notes (Signed)
 E visit for Flu like symptoms   We are sorry that you are not feeling well.  Here is how we plan to help! Based on what you have shared with me it looks like you may have a respiratory virus that may be influenza.  Influenza or "the flu" is  an infection caused by a respiratory virus. The flu virus is highly contagious and persons who did not receive their yearly flu vaccination may "catch" the flu from close contact.  We have anti-viral medications to treat the viruses that cause this infection. They are not a "cure" and only shorten the course of the infection. These prescriptions are most effective when they are given within the first 2 days of "flu" symptoms. Antiviral medications are indicated if you have a high risk of complications from the flu. You should  also consider an antiviral medication if you are in close contact with someone who is at risk. These medications can help patients avoid complications from the flu but have side effects that you should know.   Possible side effects from Tamiflu or oseltamivir include nausea, vomiting, diarrhea, dizziness, headaches, eye redness, sleep problems or other respiratory symptoms. You should not take Tamiflu if you have an allergy to oseltamivir or any to the ingredients in Tamiflu.  Based upon your symptoms and potential risk factors I have prescribed Oseltamivir (Tamiflu).  It has been sent to your designated pharmacy.  You will take one 75 mg capsule orally twice a day for the next 5 days.   For nasal congestion, you may use an oral decongestant such as Mucinex  D or if you have glaucoma or high blood pressure use plain Mucinex .  Saline nasal spray or nasal drops can help and can safely be used as often as needed for congestion.  If you have a sore or scratchy throat, use a saltwater gargle-  to  teaspoon of salt dissolved in a 4-ounce to 8-ounce glass of warm water.  Gargle the solution for approximately 15-30 seconds and then spit.  It is  important not to swallow the solution.  You can also use throat lozenges/cough drops and Chloraseptic spray to help with throat pain or discomfort.  Warm or cold liquids can also be helpful in relieving throat pain.  For headache, pain or general discomfort, you can use Ibuprofen  or Tylenol  as directed.   Some authorities believe that zinc sprays or the use of Echinacea may shorten the course of your symptoms.  I have prescribed the following medications to help lessen symptoms: I have prescribed Tessalon  Perles 100 mg. You may take 1-2 capsules every 8 hours as needed for cough  You are to isolate at home until you have been fever-free for at least 24 hours without a fever-reducing medication, and symptoms have been steadily improving for 24 hours.  If you must be around other household members who do not have symptoms, you need to make sure that both you and the family members are masking consistently with a high-quality mask.  If you note any worsening of symptoms despite treatment, please seek an in-person evaluation ASAP. If you note any significant shortness of breath or any chest pain, please seek ED evaluation. Please do not delay care!  ANYONE WHO HAS FLU SYMPTOMS SHOULD: Stay home. The flu is highly contagious and going out or to work exposes others! Be sure to drink plenty of fluids. Water is fine as well as fruit juices, sodas and electrolyte beverages. You may want to stay  away from caffeine or alcohol. If you are nauseated, try taking small sips of liquids. How do you know if you are getting enough fluid? Your urine should be a pale yellow or almost colorless. Get rest. Taking a steamy shower or using a humidifier may help nasal congestion and ease sore throat pain. Using a saline nasal spray works much the same way. Cough drops, hard candies and sore throat lozenges may ease your cough. Line up a caregiver. Have someone check on you regularly.  GET HELP RIGHT AWAY IF: You cannot  keep down liquids or your medications. You become short of breath Your fell like you are going to pass out or loose consciousness. Your symptoms persist after you have completed your treatment plan  MAKE SURE YOU  Understand these instructions. Will watch your condition. Will get help right away if you are not doing well or get worse.  Your e-visit answers were reviewed by a board certified advanced clinical practitioner to complete your personal care plan.  Depending on the condition, your plan could have included both over the counter or prescription medications.  If there is a problem please reply  once you have received a response from your provider.  Your safety is important to us .  If you have drug allergies check your prescription carefully.    You can use MyChart to ask questions about today's visit, request a non-urgent call back, or ask for a work or school excuse for 24 hours related to this e-Visit. If it has been greater than 24 hours you will need to follow up with your provider, or enter a new e-Visit to address those concerns.  You will get an e-mail in the next two days asking about your experience.  I hope that your e-visit has been valuable and will speed your recovery. Thank you for using e-visits.   I have spent 5 minutes in review of e-visit questionnaire, review and updating patient chart, medical decision making and response to patient.   Elsie Velma Lunger, PA-C
